# Patient Record
Sex: Male | Born: 1955 | Race: White | Hispanic: No | Marital: Married | Smoking: Never smoker
Health system: Southern US, Community
[De-identification: ages and names within clinical notes are randomized; demographics above are authoritative.]

## PROBLEM LIST (undated history)

## (undated) DIAGNOSIS — D649 Anemia, unspecified: Secondary | ICD-10-CM

## (undated) HISTORY — PX: HERNIA REPAIR: SHX51

## (undated) HISTORY — PX: MENISCUS REPAIR: SHX5179

## (undated) HISTORY — PX: LUMBAR MICRODISCECTOMY: SHX99

---

## 2020-07-09 ENCOUNTER — Encounter (HOSPITAL_COMMUNITY): Payer: Self-pay | Admitting: Emergency Medicine

## 2020-07-09 ENCOUNTER — Other Ambulatory Visit: Payer: Self-pay

## 2020-07-09 ENCOUNTER — Inpatient Hospital Stay (HOSPITAL_COMMUNITY)
Admission: EM | Admit: 2020-07-09 | Discharge: 2020-07-13 | DRG: 310 | Disposition: A | Discharge status: Home / Self Care | Payer: PRIVATE HEALTH INSURANCE | Attending: Cardiology | Admitting: Cardiology

## 2020-07-09 ENCOUNTER — Emergency Department (HOSPITAL_COMMUNITY): Payer: PRIVATE HEALTH INSURANCE

## 2020-07-09 DIAGNOSIS — Z862 Personal history of diseases of the blood and blood-forming organs and certain disorders involving the immune mechanism: Secondary | ICD-10-CM

## 2020-07-09 DIAGNOSIS — R0602 Shortness of breath: Secondary | ICD-10-CM | POA: Diagnosis present

## 2020-07-09 DIAGNOSIS — R7303 Prediabetes: Secondary | ICD-10-CM | POA: Diagnosis present

## 2020-07-09 DIAGNOSIS — I351 Nonrheumatic aortic (valve) insufficiency: Secondary | ICD-10-CM | POA: Diagnosis present

## 2020-07-09 DIAGNOSIS — I4891 Unspecified atrial fibrillation: Secondary | ICD-10-CM | POA: Diagnosis present

## 2020-07-09 DIAGNOSIS — D696 Thrombocytopenia, unspecified: Secondary | ICD-10-CM | POA: Diagnosis present

## 2020-07-09 DIAGNOSIS — I4892 Unspecified atrial flutter: Principal | ICD-10-CM | POA: Diagnosis present

## 2020-07-09 DIAGNOSIS — E785 Hyperlipidemia, unspecified: Secondary | ICD-10-CM | POA: Diagnosis present

## 2020-07-09 DIAGNOSIS — I483 Typical atrial flutter: Secondary | ICD-10-CM

## 2020-07-09 DIAGNOSIS — Z20822 Contact with and (suspected) exposure to covid-19: Secondary | ICD-10-CM | POA: Diagnosis present

## 2020-07-09 HISTORY — DX: Anemia, unspecified: D64.9

## 2020-07-09 LAB — BASIC METABOLIC PANEL
Anion gap: 8 (ref 5–15)
BUN: 26 mg/dL — ABNORMAL HIGH (ref 8–23)
CO2: 27 mmol/L (ref 22–32)
Calcium: 8.8 mg/dL — ABNORMAL LOW (ref 8.9–10.3)
Chloride: 101 mmol/L (ref 98–111)
Creatinine, Ser: 0.91 mg/dL (ref 0.61–1.24)
GFR, Estimated: >60 mL/min (ref >60)
Glucose, Bld: 94 mg/dL (ref 70–99)
Potassium: 3.9 mmol/L (ref 3.5–5.1)
Sodium: 136 mmol/L (ref 135–145)

## 2020-07-09 LAB — D-DIMER, QUANTITATIVE: D-Dimer, Quant: 0.57 ug/mL-FEU — ABNORMAL HIGH (ref 0.00–0.50)

## 2020-07-09 LAB — CBC
HCT: 42.5 % (ref 39.0–52.0)
Hemoglobin: 13.6 g/dL (ref 13.0–17.0)
MCH: 29.2 pg (ref 26.0–34.0)
MCHC: 32 g/dL (ref 30.0–36.0)
MCV: 91.4 fL (ref 80.0–100.0)
Platelets: 136 10*3/uL — ABNORMAL LOW (ref 150–400)
RBC: 4.65 MIL/uL (ref 4.22–5.81)
RDW: 12.8 % (ref 11.5–15.5)
WBC: 5.8 10*3/uL (ref 4.0–10.5)
nRBC: 0 % (ref 0.0–0.2)

## 2020-07-09 LAB — URINALYSIS, ROUTINE W REFLEX MICROSCOPIC
Bilirubin Urine: NEGATIVE
Glucose, UA: NEGATIVE mg/dL
Hgb urine dipstick: NEGATIVE
Ketones, ur: NEGATIVE mg/dL
Leukocytes,Ua: NEGATIVE
Nitrite: NEGATIVE
Protein, ur: NEGATIVE mg/dL
Specific Gravity, Urine: 1.019 (ref 1.005–1.030)
pH: 5 (ref 5.0–8.0)

## 2020-07-09 LAB — MAGNESIUM: Magnesium: 2.1 mg/dL (ref 1.7–2.4)

## 2020-07-09 LAB — BRAIN NATRIURETIC PEPTIDE: B Natriuretic Peptide: 83.5 pg/mL (ref 0.0–100.0)

## 2020-07-09 LAB — CBG MONITORING, ED: Glucose-Capillary: 98 mg/dL (ref 70–99)

## 2020-07-09 LAB — TROPONIN I (HIGH SENSITIVITY)
Troponin I (High Sensitivity): 6 ng/L (ref <18)
Troponin I (High Sensitivity): 6 ng/L (ref <18)

## 2020-07-09 LAB — TSH: TSH: 3.541 u[IU]/mL (ref 0.350–4.500)

## 2020-07-09 MED ORDER — HEPARIN BOLUS VIA INFUSION
3500.0000 [IU] | Freq: Once | INTRAVENOUS | Status: AC
  Administered 2020-07-09: 3500 [IU] via INTRAVENOUS
  Filled 2020-07-09: qty 3500

## 2020-07-09 MED ORDER — HEPARIN (PORCINE) 25000 UT/250ML-% IV SOLN
1000.0000 [IU]/h | INTRAVENOUS | Status: DC
  Administered 2020-07-10: 1000 [IU]/h via INTRAVENOUS
  Filled 2020-07-09: qty 250

## 2020-07-09 MED ORDER — SODIUM CHLORIDE 0.9% FLUSH
3.0000 mL | Freq: Two times a day (BID) | INTRAVENOUS | Status: DC
  Administered 2020-07-09 – 2020-07-13 (×3): 3 mL via INTRAVENOUS

## 2020-07-09 MED ORDER — DILTIAZEM HCL-DEXTROSE 125-5 MG/125ML-% IV SOLN (PREMIX)
5.0000 mg/h | INTRAVENOUS | Status: DC
  Administered 2020-07-10: 5 mg/h via INTRAVENOUS
  Filled 2020-07-09 (×2): qty 125

## 2020-07-09 MED ORDER — METOPROLOL TARTRATE 25 MG PO TABS
25.0000 mg | ORAL_TABLET | Freq: Once | ORAL | Status: AC
  Administered 2020-07-09: 25 mg via ORAL
  Filled 2020-07-09: qty 1

## 2020-07-09 NOTE — H&P (Signed)
HISTORY AND PHYSICAL  Patient ID: Marco White MRN: [id number] DOB/AGE: [birth year] 65 y.o.  Admit date: 07/09/2020  Attending physician: Derwood Kaplan, MD Primary Physician:  Pcp, No  Chief complaint: Shortness of breath and tachycardia  HPI:  Marco White is a 65 y.o. male who presents with a chief complaint of " shortness of breath and tachycardia." His past medical history and cardiovascular risk factors include: Anemia.  Patient is a practicing nephrologist at [employer] presents to the hospital with a chief complaint of shortness of breath and tachycardia.  He states that shortness of breath has been going on for at least 2 weeks intermittently mostly brought on by effort activities.  He states that his office is on the seventh floor and by the time he gets breath.  He denies any effort related chest pain.  He recently came back from a conference that was in [place] and after coming back home he checked his vitals and his heart rate was 140 bpm and blood pressures have been soft.  In addition to the shortness of breath he comes to the hospital for further evaluation and management.  No prior history of bleeding.  Currently on iron supplements.  ALLERGIES: No Known Allergies  PAST MEDICAL HISTORY: Past Medical History:  Diagnosis Date  . Anemia     PAST SURGICAL HISTORY: Past Surgical History:  Procedure Laterality Date  . HERNIA REPAIR    . LUMBAR MICRODISCECTOMY    . MENISCUS REPAIR      FAMILY HISTORY: No family history of premature coronary artery disease or sudden cardiac death.  SOCIAL HISTORY:  The patient  reports that he has never smoked. He has never used smokeless tobacco. He reports current alcohol use. He reports that he does not use drugs.  MEDICATIONS: No current outpatient medications  REVIEW OF SYSTEMS: Review of Systems  Constitutional: Negative for chills and fever.  HENT: Negative for hoarse voice and nosebleeds.   Eyes: Negative for  discharge, double vision and pain.  Cardiovascular: Positive for dyspnea on exertion. Negative for chest pain, claudication, leg swelling, near-syncope, orthopnea, palpitations, paroxysmal nocturnal dyspnea and syncope.  Respiratory: Positive for shortness of breath. Negative for hemoptysis.   Musculoskeletal: Negative for muscle cramps and myalgias.  Gastrointestinal: Negative for abdominal pain, constipation, diarrhea, hematemesis, hematochezia, melena, nausea and vomiting.  Neurological: Negative for dizziness and light-headedness.  All other systems reviewed and are negative.   PHYSICAL EXAM: Vitals with BMI 07/09/2020 07/09/2020  Height - 5\' 11"   Weight - 150 lbs  BMI - 20.93  Systolic 122 114  Diastolic 92 80  Pulse 139 82    No intake or output data in the 24 hours ending 07/09/20 2107  Net IO Since Admission: No IO data has been entered for this period [07/09/20 2107] CONSTITUTIONAL: Well-developed and well-nourished. No acute distress.  SKIN: Skin is warm and dry. No rash noted. No cyanosis. No pallor. No jaundice HEAD: Normocephalic and atraumatic.  EYES: No scleral icterus MOUTH/THROAT: Moist oral membranes.  NECK: No JVD present. No thyromegaly noted. No carotid bruits  LYMPHATIC: No visible cervical adenopathy.  CHEST Normal respiratory effort. No intercostal retractions  LUNGS: Clear to auscultation bilaterally.  No stridor. No wheezes. No rales.  CARDIOVASCULAR: Irregularly, tachycardic, variable S1-S2, no murmurs rubs or gallops appreciated secondary to tachycardia ABDOMINAL: No apparent ascites.  EXTREMITIES: No peripheral edema  HEMATOLOGIC: No significant bruising NEUROLOGIC: Oriented to person, place, and time. Nonfocal. Normal muscle tone.  PSYCHIATRIC: Normal mood and affect.  Normal behavior. Cooperative  RADIOLOGY: DG Chest Portable 1 View  Result Date: 07/09/2020 CLINICAL DATA:  Exertional shortness of breath. Dizziness for the past hour. Tachycardia. EXAM:  PORTABLE CHEST 1 VIEW COMPARISON:  None. FINDINGS: Mildly enlarged cardiac silhouette. Clear lungs with normal vascularity. Mild lower thoracic spine degenerative change. IMPRESSION: Mild cardiomegaly. No acute abnormality. Electronically Signed   By: Beckie Salts M.D.   On: 07/09/2020 20:07    LABORATORY DATA: Lab Results  Component Value Date   WBC 5.8 07/09/2020   HGB 13.6 07/09/2020   HCT 42.5 07/09/2020   MCV 91.4 07/09/2020   PLT 136 (L) 07/09/2020    Recent Labs  Lab 07/09/20 1934  NA 136  K 3.9  CL 101  CO2 27  BUN 26*  CREATININE 0.91  CALCIUM 8.8*  GLUCOSE 94    Lipid Panel  No results found for: CHOL, HDL, LDLCALC, LDLDIRECT, TRIG, CHOLHDL  BNP (last 3 results) Recent Labs    07/09/20 1934  BNP 83.5    HEMOGLOBIN A1C No results found for: HGBA1C, MPG  Cardiac Panel (last 3 results) No results for input(s): CKTOTAL, CKMB, RELINDX in the last 8760 hours.  Invalid input(s): TROPONINHS  No results found for: CKTOTAL, CKMB, CKMBINDEX   TSH No results for input(s): TSH in the last 8760 hours.    CARDIAC DATABASE: EKG: 07/09/2020: Atypical atrial flutter, 99 bpm, incomplete right bundle branch block, left posterior fascicular block, without underlying injury pattern.  Echocardiogram: None  Stress Testing:  None  Heart Catheterization: None  IMPRESSION & RECOMMENDATIONS: Marco White is a 65 y.o. male whose past medical history and cardiovascular risk factors include: Anemia.  #1 Narrow complex tachycardia (suggestive of atrial flutter) Recommend initiation of IV diltiazem.   However, patient would prefer oral medication given his soft blood pressures noted at home.  Shared decision was to start Lopressor 25 mg p.o. x1 as per patient's wishes; however, if no change in ventricular rate will IV diltiazem drip for rate control. Since his symptoms have been going on for approximately 2 weeks I suspect that he has been having underlying paroxysmal  atrial flutter and therefore would recommend anticoagulation for 4 weeks.  CHA2DS2-VASc score is 0 based on what is known thus far; therefore, I do not anticipate that he will require anticoagulation long-term.  But will check echo to evaluate his EF and a1c to screen for diabetes. Further recommendation is to follow. Check hemoglobin A1c, TSH.  #2 Shortness of breath: Most likely secondary to #1 Check CBC to rule out underlying anemia Check BNP  Check D-Dimer, due to tachycardia / recent travel  Echo in the morning.   #3 Hx of anemia:  On oral supplements.  Check Hb Does not endorse evidence of bleeding.   Consultants: None   Code Status: Full Code    Family Communication:  Spoke to his wife Dr. Vickey Huger who was present at bedside.    Disposition Plan: Home    Total encounter time 83 minutes. *Total Encounter Time as defined by the Centers for Medicare and Medicaid Services includes, in addition to the face-to-face time of a patient visit (documented in the note above) non-face-to-face time: obtaining and reviewing outside history, ordering and reviewing medications, tests or procedures, care coordination (communications with other health care professionals or caregivers) and documentation in the medical record.  Patient's questions and concerns were addressed to his satisfaction. He voices understanding of the instructions provided during this encounter.   This note was created  using a voice recognition software as a result there may be grammatical errors inadvertently enclosed that do not reflect the nature of this encounter. Every attempt is made to correct such errors.  Delilah Shan [facility]  Pager: [phone] Office: [phone] 07/09/2020, 9:07 PM

## 2020-07-09 NOTE — ED Provider Notes (Signed)
[name] 4TH FLOOR PROGRESSIVE CARE AND UROLOGY Provider Note   CSN: [id number] Arrival date & time: 07/09/20  1915     History Chief Complaint  Patient presents with  . Near Syncope    Marco White is a 65 y.o. male.  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 Patient reports that he has had episodic dizziness for the last 2 or 3 weeks.  He is very active and climbs up 7 stories to get to work, and on couple of occasions he was short of breath at the end of it.  He was in [place] for a conference and while running to the bus he felt short of breath with chief found to be unusual yesterday.  Today he was walking around his house and had dizziness type feeling and checked his heart rate, it was in the 140s.  He came to the ER, and they have already notified the cardiology service to come and see him as well.  Pt has no hx of PE, DVT and denies any exogenous hormone (testosterone / estrogen) use, long distance travels or surgery in the past 6 weeks, active cancer, recent immobilization.  There is no history of severe anemia, thyroid disorder, substance use/abuse, heavy alcohol use.  No family history of premature CAD.  No history of COVID-19.  Past Medical History:  Diagnosis Date  . Anemia     Patient Active Problem List   Diagnosis Date Noted  . Shortness of breath 07/09/2020    Past Surgical History:  Procedure Laterality Date  . HERNIA REPAIR    . LUMBAR MICRODISCECTOMY    . MENISCUS REPAIR         History reviewed. No pertinent family history.  Social History   Tobacco Use  . Smoking status: Never Smoker  . Smokeless tobacco: Never Used  Vaping Use  . Vaping Use: Never used  Substance Use Topics  . Alcohol use: Yes    Comment: Occasional  . Drug use: Never    Home Medications Prior to Admission medications   Medication Sig Start Date End Date Taking? Authorizing Provider  cholecalciferol  (VITAMIN D3) 25 MCG (1000 UNIT) tablet Take 1,000 Units by mouth daily.   Yes [provider]  finasteride (PROSCAR) 5 MG tablet Take 1 mg by mouth daily.   Yes [provider]  NONFORMULARY OR COMPOUNDED ITEM Apply 1 application topically daily. Fluconazole 2%Terb 1% DMSO - Apply to toe nail   Yes [provider]    Allergies    Patient has no known allergies.  Review of Systems   Review of Systems  Constitutional: Positive for activity change.  Respiratory: Positive for shortness of breath.   Cardiovascular: Negative for chest pain and palpitations.  Neurological: Positive for dizziness.  All other systems reviewed and are negative.   Physical Exam Updated Vital Signs BP 126/81   Pulse 82   Temp 97.8 F (36.6 C) (Oral)   Resp 14   Ht 5\' 11"  (1.803 m)   Wt 68 kg   SpO2 100%   BMI 20.92 kg/m   Physical Exam Vitals and nursing note reviewed.  Constitutional:      Appearance: He is well-developed.  HENT:     Head: Atraumatic.  Cardiovascular:     Rate and Rhythm: Normal rate.  Pulmonary:     Effort: Pulmonary effort is normal.  Musculoskeletal:     Cervical back: Neck  supple.  Skin:    General: Skin is warm.  Neurological:     Mental Status: He is alert and oriented to person, place, and time.     ED Results / Procedures / Treatments   Labs (all labs ordered are listed, but only abnormal results are displayed) Labs Reviewed  BASIC METABOLIC PANEL - Abnormal; Notable for the following components:      Result Value   BUN 26 (*)    Calcium 8.8 (*)    All other components within normal limits  CBC - Abnormal; Notable for the following components:   Platelets 136 (*)    All other components within normal limits  D-DIMER, QUANTITATIVE - Abnormal; Notable for the following components:   D-Dimer, Quant 0.57 (*)    All other components within normal limits  SARS CORONAVIRUS 2 (TAT 6-24 HRS)  URINALYSIS, ROUTINE W REFLEX MICROSCOPIC   BRAIN NATRIURETIC PEPTIDE  TSH  MAGNESIUM  HIV ANTIBODY (ROUTINE TESTING W REFLEX)  APTT  PROTIME-INR  HEPARIN LEVEL (UNFRACTIONATED)  CBC  CBG MONITORING, ED  TROPONIN I (HIGH SENSITIVITY)  TROPONIN I (HIGH SENSITIVITY)    EKG EKG Interpretation  Date/Time:  Saturday July 09 2020 20:19:52 EDT Ventricular Rate:  140 PR Interval:  117 QRS Duration: 117 QT Interval:  342 QTC Calculation: 522 R Axis:   102 Text Interpretation: Sinus tachycardia Consider right atrial enlargement RBBB and LPFB Inferior infarct, acute Lateral leads are also involved Confirmed by Derwood Kaplan [phone]) on 07/09/2020 8:35:58 PM A flutter versus atrial tachycardia    Radiology DG Chest Portable 1 View  Result Date: 07/09/2020 CLINICAL DATA:  Exertional shortness of breath. Dizziness for the past hour. Tachycardia. EXAM: PORTABLE CHEST 1 VIEW COMPARISON:  None. FINDINGS: Mildly enlarged cardiac silhouette. Clear lungs with normal vascularity. Mild lower thoracic spine degenerative change. IMPRESSION: Mild cardiomegaly. No acute abnormality. Electronically Signed   By: Beckie Salts M.D.   On: 07/09/2020 20:07    Procedures .Critical Care Performed by: Derwood Kaplan, MD Authorized by: Derwood Kaplan, MD   Critical care provider statement:    Critical care time (minutes):  42   Critical care was necessary to treat or prevent imminent or life-threatening deterioration of the following conditions:  Circulatory failure and cardiac failure   Critical care was time spent personally by me on the following activities:  Discussions with consultants, evaluation of patient's response to treatment, examination of patient, ordering and performing treatments and interventions, ordering and review of laboratory studies, ordering and review of radiographic studies, pulse oximetry, re-evaluation of patient's condition, obtaining history from patient or surrogate and review of old charts     Medications Ordered  in ED Medications  sodium chloride flush (NS) 0.9 % injection 3 mL (3 mLs Intravenous Given 07/09/20 2219)  diltiazem (CARDIZEM) 125 mg in dextrose 5% 125 mL (1 mg/mL) infusion (has no administration in time range)  heparin bolus via infusion 3,500 Units (has no administration in time range)  heparin ADULT infusion 100 units/mL (25000 units/229mL) (has no administration in time range)  metoprolol tartrate (LOPRESSOR) tablet 25 mg (25 mg Oral Given 07/09/20 2039)    ED Course  I have reviewed the triage vital signs and the nursing notes.  Pertinent labs & imaging results that were available during my care of the patient were reviewed by me and considered in my medical decision making (see chart for details).    MDM Rules/Calculators/A&P  65 year old healthy male comes in with chief complaint of dizziness and elevated heart rate. Initial EKG shows heart rate in the 140s.  Initial concerns for me is that patient likely has a flutter as the underlying cause.  Differential also includes sinus tachycardia and less likely PSVT.  Patient has no risk factors for PE, DVT.  He does not appear that he is having thyroid storm.  He has iron deficiency with no history of severe anemia and denies any blood loss.  Family history is reassuring and there is no chest pain -concerns for ACS.  No murmurs on exam.  Patient was given oral metoprolol after discussion with cardiology service. Patient's heart rate improved to 86, and we could clearly see that he was in a flutter.  Heparin initiated.  Spoke with cardiology service again, they would like patient to get diltiazem drip, which we have initiated.  Results of the ER work-up has been discussed with the patient.  CHA2DS2-VASc Score = 0  The patient's score is based upon: CHF History: No HTN History: No Diabetes History: No Stroke History: No Vascular Disease History: No Age Score: 0 Gender Score: 0      ASSESSMENT AND  PLAN: Atrial flutter   Signed,  Derwood Kaplan, MD    07/09/2020 11:55 PM     Final Clinical Impression(s) / ED Diagnoses Final diagnoses:  Atrial fibrillation, unspecified type [facility])    Rx / DC Orders ED Discharge Orders    None       Derwood Kaplan, MD 07/09/20 2356

## 2020-07-09 NOTE — ED Triage Notes (Signed)
Patient states that about an hour ago, he felt lightheaded and dizzy. Patient states that he took his blood pressure and pulse at home. Patient states pulse was 140.

## 2020-07-09 NOTE — Progress Notes (Signed)
ANTICOAGULATION CONSULT NOTE - Initial Consult  Pharmacy Consult for heparin Indication: atrial fibrillation  No Known Allergies  Patient Measurements: Height: 5\' 11"  (180.3 cm) Weight: 68 kg (150 lb) IBW/kg (Calculated) : 75.3 Heparin Dosing Weight: 68kg  Vital Signs: Temp: 98.3 F (36.8 C) (04/09 1926) Temp Source: Oral (04/09 1926) BP: 113/85 (04/09 2130) Pulse Rate: 52 (04/09 2130)  Labs: Recent Labs    07/09/20 1934  HGB 13.6  HCT 42.5  PLT 136*  CREATININE 0.91  TROPONINIHS 6    Estimated Creatinine Clearance: 81 mL/min (by C-G formula based on SCr of 0.91 mg/dL).   Medical History: Past Medical History:  Diagnosis Date  . Anemia      Assessment: 65 yo male presents with exertional SOB and dizziness.  Pharmacy consulted to dose heparin drip for Afib.  No prior AC noted  Hgb 13.6 Plts 136 Scr WNL Baseline aPTT and pt/inr ordered  Goal of Therapy:  Heparin level 0.3-0.7 units/ml Monitor platelets by anticoagulation protocol:   Plan:  Heparin bolus 3500 units Start heparin drip at 1000 units/hr Heparin level in 6 hours Daily CBC  68 RPh 07/09/2020, 10:26 PM

## 2020-07-09 NOTE — ED Triage Notes (Signed)
Emergency Medicine Provider Triage Evaluation Note  Marco White , a 65 y.o. male  was evaluated in triage.  Pt complains of exertional shortness of breath and dizziness.  Patient reports that his exertional shortness of breath has been present for last few weeks.  Has gradually progressed over this time.  Patient reports that today after an episode of walking he felt lightheaded and dizzy.  Patient checked his blood pressure and pulse found to have a pulse rate of 140.  Patient denies any palpitations or syncope.  Review of Systems  Positive: Shortness of breath on exertion, lightheadedness, dizziness Negative: Fevers, chills, chest pain, leg swelling, cough  Physical Exam  BP 114/80 (BP Location: Right Arm)   Pulse 82   Temp 98.3 F (36.8 C) (Oral)   Resp 16   Ht 5\' 11"  (1.803 m)   Wt 68 kg   SpO2 100%   BMI 20.92 kg/m  Gen:   Awake, no distress   HEENT:  Atraumatic  Resp:  Normal effort, lungs clear to auscultation bilaterally Cardiac:  Normal rate, no murmurs rubs or gallops, +3 radial pulse bilaterally MSK:   Moves extremities without difficulty  Neuro:  Speech clear   Medical Decision Making  Medically screening exam initiated at 7:48 PM.  Appropriate orders placed.  Zaiyden Strozier was informed that the remainder of the evaluation will be completed by another provider, this initial triage assessment does not replace that evaluation, and the importance of remaining in the ED until their evaluation is complete.  Clinical Impression   The patient appears stable so that the remainder of the work up may be completed by another provider.      Lester Kinsman, Haskel Schroeder 07/09/20 1950

## 2020-07-09 NOTE — ED Notes (Signed)
ED TO INPATIENT HANDOFF REPORT  Name/Age/Gender Marco White 65 y.o. male  Code Status    Code Status Orders  (From admission, onward)         Start     Ordered   07/09/20 2133  Full code  Continuous        07/09/20 2135        Code Status History    This patient has a current code status but no historical code status.   Advance Care Planning Activity      Home/SNF/Other Home  Chief Complaint Shortness of breath [R06.02]  Level of Care/Admitting Diagnosis ED Disposition    ED Disposition Condition Comment   Admit  Hospital Area: [facility] [[id number]]  Level of Care: Telemetry [5]  Admit to tele based on following criteria: Other see comments  Comments: new atrial flutter  Covid Evaluation: Asymptomatic Screening Protocol (No Symptoms)  Diagnosis: Shortness of breath [786.05.ICD-9-CM]  Admitting Physician: Tessa Lerner [[id number]]  Attending Physician: Tessa Lerner [[id number]]  Bed request comments: cardiac floor (IV drips)       Medical History Past Medical History:  Diagnosis Date  . Anemia     Allergies No Known Allergies  IV Location/Drains/Wounds Patient Lines/Drains/Airways Status    Active Line/Drains/Airways    None          Labs/Imaging Results for orders placed or performed during the hospital encounter of 07/09/20 (from the past 48 hour(s))  Urinalysis, Routine w reflex microscopic     Status: None   Collection Time: 07/09/20  7:21 PM  Result Value Ref Range   Color, Urine YELLOW YELLOW   APPearance CLEAR CLEAR   Specific Gravity, Urine 1.019 1.005 - 1.030   pH 5.0 5.0 - 8.0   Glucose, UA NEGATIVE NEGATIVE mg/dL   Hgb urine dipstick NEGATIVE NEGATIVE   Bilirubin Urine NEGATIVE NEGATIVE   Ketones, ur NEGATIVE NEGATIVE mg/dL   Protein, ur NEGATIVE NEGATIVE mg/dL   Nitrite NEGATIVE NEGATIVE   Leukocytes,Ua NEGATIVE NEGATIVE    Comment: Performed at [facility], [address]  [place], [place] [address]  CBG monitoring, ED     Status: None   Collection Time: 07/09/20  7:33 PM  Result Value Ref Range   Glucose-Capillary 98 70 - 99 mg/dL    Comment: Glucose reference range applies only to samples taken after fasting for at least 8 hours.  Basic metabolic panel     Status: Abnormal   Collection Time: 07/09/20  7:34 PM  Result Value Ref Range   Sodium 136 135 - 145 mmol/L   Potassium 3.9 3.5 - 5.1 mmol/L   Chloride 101 98 - 111 mmol/L   CO2 27 22 - 32 mmol/L   Glucose, Bld 94 70 - 99 mg/dL    Comment: Glucose reference range applies only to samples taken after fasting for at least 8 hours.   BUN 26 (H) 8 - 23 mg/dL   Creatinine, Ser 8.36 0.61 - 1.24 mg/dL   Calcium 8.8 (L) 8.9 - 10.3 mg/dL   GFR, Estimated >62 >94 mL/min    Comment: (NOTE) Calculated using the CKD-EPI Creatinine Equation (2021)    Anion gap 8 5 - 15    Comment: Performed at [facility], [address], [place], [place] [address]  CBC     Status: Abnormal   Collection Time: 07/09/20  7:34 PM  Result Value Ref Range   WBC 5.8 4.0 - 10.5 K/uL  Comment: REPEATED TO VERIFY WHITE COUNT CONFIRMED ON SMEAR    RBC 4.65 4.22 - 5.81 MIL/uL   Hemoglobin 13.6 13.0 - 17.0 g/dL   HCT 27.0 35.0 - 09.3 %   MCV 91.4 80.0 - 100.0 fL   MCH 29.2 26.0 - 34.0 pg   MCHC 32.0 30.0 - 36.0 g/dL   RDW 81.8 29.9 - 37.1 %   Platelets 136 (L) 150 - 400 K/uL   nRBC 0.0 0.0 - 0.2 %    Comment: Performed at [facility], [address], [place], [place] [address]  Troponin I (High Sensitivity)     Status: None   Collection Time: 07/09/20  7:34 PM  Result Value Ref Range   Troponin I (High Sensitivity) 6 <18 ng/L    Comment: (NOTE) Elevated high sensitivity troponin I (hsTnI) values and significant  changes across serial measurements may suggest ACS but many other  chronic and acute conditions are known to elevate hsTnI results.  Refer to the "Links" section for chest pain  algorithms and additional  guidance. Performed at [facility], [address] [place], [place] [address]   Brain natriuretic peptide     Status: None   Collection Time: 07/09/20  7:34 PM  Result Value Ref Range   B Natriuretic Peptide 83.5 0.0 - 100.0 pg/mL    Comment: Performed at [facility], [address], [place], [place] [address]  Magnesium     Status: None   Collection Time: 07/09/20  7:34 PM  Result Value Ref Range   Magnesium 2.1 1.7 - 2.4 mg/dL    Comment: Performed at [facility], [address] [place], [place] [address]  D-dimer, quantitative     Status: Abnormal   Collection Time: 07/09/20  8:26 PM  Result Value Ref Range   D-Dimer, Quant 0.57 (H) 0.00 - 0.50 ug/mL-FEU    Comment: (NOTE) At the manufacturer cut-off value of 0.5 g/mL FEU, this assay has a negative predictive value of 95-100%.This assay is intended for use in conjunction with a clinical pretest probability (PTP) assessment model to exclude pulmonary embolism (PE) and deep venous thrombosis (DVT) in outpatients suspected of PE or DVT. Results should be correlated with clinical presentation. Performed at [facility], [address] [place], [place] [address]    DG Chest Portable 1 View  Result Date: 07/09/2020 CLINICAL DATA:  Exertional shortness of breath. Dizziness for the past hour. Tachycardia. EXAM: PORTABLE CHEST 1 VIEW COMPARISON:  None. FINDINGS: Mildly enlarged cardiac silhouette. Clear lungs with normal vascularity. Mild lower thoracic spine degenerative change. IMPRESSION: Mild cardiomegaly. No acute abnormality. Electronically Signed   By: Beckie Salts M.D.   On: 07/09/2020 20:07    Pending Labs Unresulted Labs (From admission, onward)          Start     Ordered   07/09/20 2136  HIV Antibody (routine testing w rflx)  (HIV Antibody (Routine testing w reflex) panel)  Once,   STAT        07/09/20 2135    07/09/20 2028  SARS CORONAVIRUS 2 (TAT 6-24 HRS) Nasopharyngeal Nasopharyngeal Swab  (Tier 3 - Symptomatic/asymptomatic with Precautions)  Once,   STAT       Question Answer Comment  Is this test for diagnosis or screening Screening   Symptomatic for COVID-19 as defined by CDC No   Hospitalized for COVID-19 No   Admitted to ICU for COVID-19 No   Previously  tested for COVID-19 No   Resident in a congregate (group) care setting No   Employed in healthcare setting No   Has patient completed COVID vaccination(s) (2 doses of Pfizer/Moderna 1 dose of [employer]) Yes   Has patient completed COVID Booster / 3rd dose Yes      07/09/20 2027   07/09/20 2026  TSH  ONCE - STAT,   STAT        07/09/20 2025          Vitals/Pain Today's Vitals   07/09/20 1931 07/09/20 2030 07/09/20 2112 07/09/20 2130  BP:  (!) 122/92 (!) 204/87 113/85  Pulse:  (!) 139 (!) 138 (!) 52  Resp:  (!) 21 18 20   Temp:      TempSrc:      SpO2:  99% 99% 99%  Weight:      Height:      PainSc: 0-No pain       Isolation Precautions Airborne and Contact precautions  Medications Medications  sodium chloride flush (NS) 0.9 % injection 3 mL (has no administration in time range)  metoprolol tartrate (LOPRESSOR) tablet 25 mg (25 mg Oral Given 07/09/20 2039)    Mobility walks

## 2020-07-10 ENCOUNTER — Observation Stay (HOSPITAL_COMMUNITY): Payer: PRIVATE HEALTH INSURANCE

## 2020-07-10 DIAGNOSIS — R0602 Shortness of breath: Secondary | ICD-10-CM | POA: Diagnosis present

## 2020-07-10 DIAGNOSIS — I4891 Unspecified atrial fibrillation: Secondary | ICD-10-CM | POA: Diagnosis present

## 2020-07-10 DIAGNOSIS — I4892 Unspecified atrial flutter: Secondary | ICD-10-CM | POA: Diagnosis present

## 2020-07-10 DIAGNOSIS — E785 Hyperlipidemia, unspecified: Secondary | ICD-10-CM | POA: Diagnosis present

## 2020-07-10 DIAGNOSIS — I351 Nonrheumatic aortic (valve) insufficiency: Secondary | ICD-10-CM

## 2020-07-10 DIAGNOSIS — Z20822 Contact with and (suspected) exposure to covid-19: Secondary | ICD-10-CM | POA: Diagnosis present

## 2020-07-10 DIAGNOSIS — D696 Thrombocytopenia, unspecified: Secondary | ICD-10-CM | POA: Diagnosis present

## 2020-07-10 DIAGNOSIS — I483 Typical atrial flutter: Secondary | ICD-10-CM

## 2020-07-10 DIAGNOSIS — R7303 Prediabetes: Secondary | ICD-10-CM | POA: Diagnosis present

## 2020-07-10 LAB — BASIC METABOLIC PANEL
Anion gap: 9 (ref 5–15)
BUN: 19 mg/dL (ref 8–23)
CO2: 26 mmol/L (ref 22–32)
Calcium: 8.8 mg/dL — ABNORMAL LOW (ref 8.9–10.3)
Chloride: 102 mmol/L (ref 98–111)
Creatinine, Ser: 0.67 mg/dL (ref 0.61–1.24)
GFR, Estimated: >60 mL/min (ref >60)
Glucose, Bld: 89 mg/dL (ref 70–99)
Potassium: 3.5 mmol/L (ref 3.5–5.1)
Sodium: 137 mmol/L (ref 135–145)

## 2020-07-10 LAB — CBC
HCT: 40.1 % (ref 39.0–52.0)
Hemoglobin: 13.5 g/dL (ref 13.0–17.0)
MCH: 30.2 pg (ref 26.0–34.0)
MCHC: 33.7 g/dL (ref 30.0–36.0)
MCV: 89.7 fL (ref 80.0–100.0)
Platelets: 122 10*3/uL — ABNORMAL LOW (ref 150–400)
RBC: 4.47 MIL/uL (ref 4.22–5.81)
RDW: 12.7 % (ref 11.5–15.5)
WBC: 5.3 10*3/uL (ref 4.0–10.5)
nRBC: 0 % (ref 0.0–0.2)

## 2020-07-10 LAB — ECHOCARDIOGRAM COMPLETE
Area-P 1/2: 3.85 cm2
Height: 71 in
P 1/2 time: 606 msec
S' Lateral: 3.2 cm
Single Plane A4C EF: 65.9 %
Weight: 2462.1 oz

## 2020-07-10 LAB — MAGNESIUM: Magnesium: 2.1 mg/dL (ref 1.7–2.4)

## 2020-07-10 LAB — APTT: aPTT: 29 seconds (ref 24–36)

## 2020-07-10 LAB — SARS CORONAVIRUS 2 (TAT 6-24 HRS): SARS Coronavirus 2: NEGATIVE

## 2020-07-10 LAB — HEPARIN LEVEL (UNFRACTIONATED)
Heparin Unfractionated: 0.32 IU/mL (ref 0.30–0.70)
Heparin Unfractionated: 0.3 IU/mL (ref 0.30–0.70)

## 2020-07-10 LAB — TROPONIN I (HIGH SENSITIVITY)
Troponin I (High Sensitivity): 6 ng/L (ref <18)
Troponin I (High Sensitivity): 6 ng/L (ref <18)

## 2020-07-10 LAB — GLUCOSE, CAPILLARY: Glucose-Capillary: 86 mg/dL (ref 70–99)

## 2020-07-10 LAB — HIV ANTIBODY (ROUTINE TESTING W REFLEX): HIV Screen 4th Generation wRfx: NONREACTIVE

## 2020-07-10 LAB — PROTIME-INR
INR: 1.1 (ref 0.8–1.2)
Prothrombin Time: 13.6 seconds (ref 11.4–15.2)

## 2020-07-10 MED ORDER — METOPROLOL TARTRATE 12.5 MG HALF TABLET
12.5000 mg | ORAL_TABLET | Freq: Two times a day (BID) | ORAL | Status: DC
  Administered 2020-07-10 – 2020-07-13 (×4): 12.5 mg via ORAL
  Filled 2020-07-10 (×5): qty 1

## 2020-07-10 MED ORDER — METOPROLOL TARTRATE 25 MG PO TABS
25.0000 mg | ORAL_TABLET | Freq: Two times a day (BID) | ORAL | Status: DC
  Administered 2020-07-10: 25 mg via ORAL
  Filled 2020-07-10: qty 1

## 2020-07-10 MED ORDER — HEPARIN (PORCINE) 25000 UT/250ML-% IV SOLN
1100.0000 [IU]/h | INTRAVENOUS | Status: AC
  Administered 2020-07-10 (×2): 1050 [IU]/h via INTRAVENOUS
  Administered 2020-07-11: 1100 [IU]/h via INTRAVENOUS
  Filled 2020-07-10 (×3): qty 250

## 2020-07-10 MED ORDER — SODIUM CHLORIDE 0.9 % IV SOLN: INTRAVENOUS | Status: AC

## 2020-07-10 MED ORDER — POTASSIUM CHLORIDE CRYS ER 20 MEQ PO TBCR
40.0000 meq | EXTENDED_RELEASE_TABLET | Freq: Once | ORAL | Status: AC
  Administered 2020-07-10: 40 meq via ORAL
  Filled 2020-07-10: qty 2

## 2020-07-10 NOTE — Plan of Care (Signed)
  Problem: Education: Goal: Knowledge of General Education information will improve Description: Including pain rating scale, medication(s)/side effects and non-pharmacologic comfort measures Outcome: Progressing   Problem: Clinical Measurements: Goal: Ability to maintain clinical measurements within normal limits will improve Outcome: Progressing   Problem: Clinical Measurements: Goal: Cardiovascular complication will be avoided Outcome: Progressing   Problem: Activity: Goal: Risk for activity intolerance will decrease Outcome: Progressing   

## 2020-07-10 NOTE — Progress Notes (Signed)
ON CALL CARDIOLOGY   Patient got to medical floor for AFL management his HR was between 60-70bpm and his nurse Rose called if Cardizem needs to be started.   Recommended to hold off starting cardizem for now and to keep it at bedside if needed over night if he goes into RVR. And to get an EKG to document the rhythm.   Got paged 1:16am that the EKG ordered reads out "Acute MI." Rapid response team was examining the patient.   That EKG is not on Epic and I requested that it be sent to me for review. EKG was done 07/10/2020 at 01:01:12. EKG reviewed and it notes Atrial flutter 4:1 conduction at HR of 69bpm. No STEMI.   VS: 115/78, MAP 89, RR 16bpm, HR 83bpm, SaO2 100% on RA.   Patient denies CP or SOB.  hsTrop checked earlier in the evening notes flat response.   Currently on IV Heparin due to AFL w/ RVR on admission.   Recommendation: 1. Close monitoring of the patient and frequent vital checks.  2. Repeat troponin x 2.  3. Repeat EKG in  4. Continue IV Heparin  5. Call if questions arise. Spoke to both patient's nurse Rose and Rapid Response nurse Genell.   Tessa Lerner, [place], [facility]  Pager: [phone] Office: [phone]

## 2020-07-10 NOTE — Progress Notes (Addendum)
ANTICOAGULATION CONSULT NOTE - Follow Up  Pharmacy Consult for heparin Indication: Atrial fibrillation  No Known Allergies  Patient Measurements: Height: 5\' 11"  (180.3 cm) Weight: 69.8 kg (153 lb 14.1 oz) IBW/kg (Calculated) : 75.3 Heparin Dosing Weight: n/a. Use TBW = 70 kg  Vital Signs: Temp: 97.8 F (36.6 C) (04/10 0749) Temp Source: Oral (04/10 0451) BP: 93/61 (04/10 0749) Pulse Rate: 70 (04/10 0749)  Labs: Recent Labs    07/09/20 1934 07/09/20 2151 07/09/20 2216 07/10/20 0426 07/10/20 0705  HGB 13.6  --   --  13.5  --   HCT 42.5  --   --  40.1  --   PLT 136*  --   --  122*  --   APTT  --   --  29  --   --   LABPROT  --   --  13.6  --   --   INR  --   --  1.1  --   --   HEPARINUNFRC  --   --   --   --  0.32  CREATININE 0.91  --   --  0.67  --   TROPONINIHS 6 6  --  6 6    Estimated Creatinine Clearance: 94.5 mL/min (by C-G formula based on SCr of 0.67 mg/dL).   Medical History: Past Medical History:  Diagnosis Date  . Anemia     Medications: Pt not taking anticoagulants PTA  Assessment: Pt is a 75 yoM presenting with SOB, tachycardia. PMH significant for anemia. Cardiology consulted - initiated heparin drip for atrial flutter/afib, pharmacy to dose.  Today, 07/10/20  HL = 0.32 is therapeutic on heparin infusion of 1000 units/hr  CBC: Hgb WNL, Plt (122) slightly low  SCr WNL  Confirmed with RN that heparin infusing at correct rate. No signs of bleeding.  Goal of Therapy:  Heparin level 0.3-0.7 units/ml Monitor platelets by anticoagulation protocol: Yes   Plan:   Continue heparin infusion at current rate of 1000 units/hr  Check confirmatory 6 hour HL  HL, CBC daily while on heparin infusion  Monitor for signs of bleeding  09/09/20, PharmD 07/10/2020,9:05 AM   Addendum - Afternoon Follow Up:  Assessment:  Confirmatory HL = 0.30 is on lower end of therapeutic range on heparin infusion of 1000 units/hr  Confirmed with RN that  heparin infusing at correct rate. No signs of bleeding.   Goal: HL 0.3 - 0.7  Plan:  Slightly increase heparin infusion to 1050 units/hr  Check 6 hour HL  HL, CBC daily while on heparin infusion  09/09/2020, PharmD 07/10/20 3:22 PM

## 2020-07-10 NOTE — Progress Notes (Addendum)
Rapid Response Event Note   Reason for Call : EKG shows Acute MI   Initial Focused Assessment: Pt lying in bed, A/O and following commands.  Pt denies chest pain, SOB or any other pain.  Pulses intact and irregular.     Interventions: Cardiology on call informed and after reviewing  EKG was done and reviewed and it notes Atrial flutter 4:1 conduction at HR of 69 bpm. No STEMI per MD. Will repeat Troponin x 2, continue heparin drip.  Pt informed to let his nurse know if he he has any pain or issues breathing. See flowhseet for VS. Repeat EKG to be done.  MD made aware.   Plan of Care: Pt to remain in current location at this time.    Conley Rolls, RN

## 2020-07-10 NOTE — Progress Notes (Signed)
  Echocardiogram 2D Echocardiogram has been performed.  Marco White 07/10/2020, 8:56 AM

## 2020-07-10 NOTE — Progress Notes (Signed)
Progress Note  Patient Name: Marco White Date of Encounter: 07/10/2020  Attending physician: Tessa Lerner, DO Primary care provider: Pcp, No Primary Cardiologist: NA Consultant:Saveah Bahar Odis Hollingshead, DO  Subjective: Marco White is a 65 y.o. male who was seen and examined at bedside at approximately 4pm No events overnight. Patient denies any chest pain or shortness of breath at rest or with effort late activities. Denies orthopnea, paroxysmal nocturnal dyspnea or lower extremity swelling. Case discussed and reviewed with his nurse.  Objective: Vital Signs in the last 24 hours: Temp:  [97.7 F (36.5 C)-98.3 F (36.8 C)] 98.3 F (36.8 C) (04/10 1300) Pulse Rate:  [52-139] 69 (04/10 1300) Resp:  [12-21] 16 (04/10 1300) BP: (91-204)/(50-94) 99/50 (04/10 1300) SpO2:  [98 %-100 %] 99 % (04/10 1300) Weight:  [68 kg-69.8 kg] 69.8 kg (04/10 0451)  Intake/Output:  Intake/Output Summary (Last 24 hours) at 07/10/2020 1651 Last data filed at 07/10/2020 1200 Gross per 24 hour  Intake 296.13 ml  Output 875 ml  Net -578.87 ml    Net IO Since Admission: -578.87 mL [07/10/20 1651]  Weights:  Filed Weights   07/09/20 1926 07/10/20 0451  Weight: 68 kg 69.8 kg    Telemetry: Personally reviewed-atrial flutter controlled ventricular rate  Physical examination: PHYSICAL EXAM: Vitals with BMI 07/10/2020 07/10/2020 07/10/2020  Height - - -  Weight - - -  BMI - - -  Systolic 99 93 91  Diastolic 50 61 58  Pulse 69 70 70    CONSTITUTIONAL: Well-developed and well-nourished. No acute distress.  SKIN: Skin is warm and dry. No rash noted. No cyanosis. No pallor. No jaundice HEAD: Normocephalic and atraumatic.  EYES: No scleral icterus MOUTH/THROAT: Moist oral membranes.  NECK: No JVD present. No thyromegaly noted. No carotid bruits  LYMPHATIC: No visible cervical adenopathy.  CHEST Normal respiratory effort. No intercostal retractions  LUNGS: Clear to auscultation bilaterally.  No stridor. No  wheezes. No rales.  CARDIOVASCULAR: Irregularly, variable S1-S2, no murmurs rubs or gallops appreciated  ABDOMINAL: No apparent ascites.  EXTREMITIES: No peripheral edema  HEMATOLOGIC: No significant bruising NEUROLOGIC: Oriented to person, place, and time. Nonfocal. Normal muscle tone.  PSYCHIATRIC: Normal mood and affect. Normal behavior. Cooperative  Lab Results: Hematology Recent Labs  Lab 07/09/20 1934 07/10/20 0426  WBC 5.8 5.3  RBC 4.65 4.47  HGB 13.6 13.5  HCT 42.5 40.1  MCV 91.4 89.7  MCH 29.2 30.2  MCHC 32.0 33.7  RDW 12.8 12.7  PLT 136* 122*    Chemistry Recent Labs  Lab 07/09/20 1934 07/10/20 0426  NA 136 137  K 3.9 3.5  CL 101 102  CO2 27 26  GLUCOSE 94 89  BUN 26* 19  CREATININE 0.91 0.67  CALCIUM 8.8* 8.8*  GFRNONAA >60 >60  ANIONGAP 8 9     Cardiac Enzymes: Cardiac Panel (last 3 results) Recent Labs    07/09/20 2151 07/10/20 0426 07/10/20 0705  TROPONINIHS 6 6 6     BNP (last 3 results) Recent Labs    07/09/20 1934  BNP 83.5    ProBNP (last 3 results) No results for input(s): PROBNP in the last 8760 hours.  DDimer  Recent Labs  Lab 07/09/20 2026  DDIMER 0.57*     Hemoglobin A1c: No results found for: HGBA1C, MPG  TSH  Recent Labs    07/09/20 2026  TSH 3.541    Lipid Panel No results found for: CHOL, TRIG, HDL, CHOLHDL, VLDL, LDLCALC, LDLDIRECT  Imaging: DG Chest Portable 1 View  Result Date: 07/09/2020 CLINICAL DATA:  Exertional shortness of breath. Dizziness for the past hour. Tachycardia. EXAM: PORTABLE CHEST 1 VIEW COMPARISON:  None. FINDINGS: Mildly enlarged cardiac silhouette. Clear lungs with normal vascularity. Mild lower thoracic spine degenerative change. IMPRESSION: Mild cardiomegaly. No acute abnormality. Electronically Signed   By: Beckie SaltsSteven  Reid M.D.   On: 07/09/2020 20:07   ECHOCARDIOGRAM COMPLETE  Result Date: 07/10/2020    ECHOCARDIOGRAM REPORT   Patient Name:   Marco KinsmanMICHAEL White Date of Exam: 07/10/2020  Medical Rec #:  [id number]     Height:       71.0 in Accession #:    [id number]    Weight:       153.9 lb Date of Birth:  Sep 09, 1955     BSA:          1.886 m Patient Age:    64 years      BP:           91/58 mmHg Patient Gender: M             HR:           70 bpm. Exam Location:  Inpatient Procedure: 2D Echo, Cardiac Doppler and Color Doppler Indications:     I48.92* Unspecified atrial flutter  History:         Patient has no prior history of Echocardiogram examinations.                  Abnormal ECG, Arrythmias:Atrial Flutter;                  Signs/Symptoms:Shortness of Breath and Dyspnea.  Sonographer:     Sheralyn Boatmanina West RDCS Referring Phys:  [id number] Tessa LernerSUNIT [place] Diagnosing Phys: Tessa LernerSunit Eleora Sutherland DO  Sonographer Comments: Technically difficult study due to poor echo windows and suboptimal apical window. Image acquisition challenging due to respiratory motion. Extremely difficult apical views. IMPRESSIONS  1. Left ventricular ejection fraction, by estimation, is 50 to 55%. The left ventricle has low normal function. The left ventricle has no regional wall motion abnormalities. Diastolic dysfunction is not assessed due to atrial flutter.  2. Right ventricular systolic function is mildly reduced. The right ventricular size is mildly enlarged. There is normal pulmonary artery systolic pressure.  3. Right atrial size was mildly dilated.  4. The mitral valve is normal in structure. No evidence of mitral valve regurgitation. No evidence of mitral stenosis.  5. The aortic valve is tricuspid. Aortic valve regurgitation is mild to moderate. No aortic stenosis is present.  6. The inferior vena cava is dilated in size with >50% respiratory variability, suggesting right atrial pressure of 8 mmHg. FINDINGS  Left Ventricle: Left ventricular ejection fraction, by estimation, is 50 to 55%. The left ventricle has low normal function. The left ventricle has no regional wall motion abnormalities. The left ventricular internal cavity size was  normal in size. There is no left ventricular hypertrophy. Diastolic dysfunction is not assessed due to atrial flutter. Right Ventricle: The right ventricular size is mildly enlarged. No increase in right ventricular wall thickness. Right ventricular systolic function is mildly reduced. There is normal pulmonary artery systolic pressure. The tricuspid regurgitant velocity  is 1.68 m/s, and with an assumed right atrial pressure of 8 mmHg, the estimated right ventricular systolic pressure is 19.3 mmHg. Left Atrium: Left atrial size was normal in size. Right Atrium: Right atrial size was mildly dilated. Pericardium: There is no evidence of pericardial effusion. Mitral Valve: The mitral valve is normal in structure.  No evidence of mitral valve regurgitation. No evidence of mitral valve stenosis. Tricuspid Valve: The tricuspid valve is normal in structure. Tricuspid valve regurgitation is trivial. No evidence of tricuspid stenosis. Aortic Valve: The aortic valve is tricuspid. Aortic valve regurgitation is mild to moderate. Aortic regurgitation PHT measures 606 msec. No aortic stenosis is present. Pulmonic Valve: The pulmonic valve was normal in structure. Pulmonic valve regurgitation is trivial. Aorta: The aortic root and ascending aorta are structurally normal, with no evidence of dilitation. Venous: The inferior vena cava is dilated in size with greater than 50% respiratory variability, suggesting right atrial pressure of 8 mmHg. IAS/Shunts: The atrial septum is grossly normal.  LEFT VENTRICLE PLAX 2D LVIDd:         4.60 cm LVIDs:         3.20 cm LV PW:         1.00 cm LV IVS:        0.90 cm LVOT diam:     2.70 cm LV SV:         74 LV SV Index:   39 LVOT Area:     5.73 cm  LV Volumes (MOD) LV vol d, MOD A4C: 78.5 ml LV vol s, MOD A4C: 26.8 ml LV SV MOD A4C:     78.5 ml RIGHT VENTRICLE            IVC RV S prime:     9.46 cm/s  IVC diam: 2.30 cm TAPSE (M-mode): 3.1 cm LEFT ATRIUM           Index       RIGHT ATRIUM            Index LA diam:      2.50 cm 1.33 cm/m  RA Area:     22.10 cm LA Vol (A4C): 34.1 ml 18.08 ml/m RA Volume:   64.50 ml  34.19 ml/m  AORTIC VALVE LVOT Vmax:   61.50 cm/s LVOT Vmean:  39.900 cm/s LVOT VTI:    0.129 m AI PHT:      606 msec  AORTA Ao Root diam: 3.50 cm MITRAL VALVE                TRICUSPID VALVE MV Area (PHT): 3.85 cm     TR Peak grad:   11.3 mmHg MV Decel Time: 197 msec     TR Vmax:        168.00 cm/s MV E velocity: 105.50 cm/s                             SHUNTS                             Systemic VTI:  0.13 m                             Systemic Diam: 2.70 cm Marco Rumore DO Electronically signed by Tessa Lerner DO Signature Date/Time: 07/10/2020/1:55:48 PM    Final     Cardiac database: EKG: 07/10/2020: Atrial flutter with 4:1 conduction with a heart rate of 69 bpm, consider inferior infarct, without underlying injury pattern.  07/10/2020: Atrial flutter 4: 1 conduction with a heart rate of 70 bpm, consider old inferior infarct.  Echocardiogram: 07/10/2020: 1. Left ventricular ejection fraction, by estimation, is 50 to 55%. The  left ventricle has low normal  function. The left ventricle has no regional  wall motion abnormalities. Diastolic dysfunction is not assessed due to  atrial flutter.  2. Right ventricular systolic function is mildly reduced. The right  ventricular size is mildly enlarged. There is normal pulmonary artery  systolic pressure.  3. Right atrial size was mildly dilated.  4. The mitral valve is normal in structure. No evidence of mitral valve  regurgitation. No evidence of mitral stenosis.  5. The aortic valve is tricuspid. Aortic valve regurgitation is mild to  moderate. No aortic stenosis is present.  6. The inferior vena cava is dilated in size with >50% respiratory  variability, suggesting right atrial pressure of 8 mmHg.   Stress test: None  Heart catheterization: None  Scheduled Meds: . metoprolol tartrate  12.5 mg Oral BID  . sodium  chloride flush  3 mL Intravenous Q12H    Continuous Infusions: . sodium chloride    . diltiazem (CARDIZEM) infusion 5 mg/hr (07/10/20 0229)  . heparin 1,050 Units/hr (07/10/20 1533)    PRN Meds:    IMPRESSION & RECOMMENDATIONS: Marco White is a 65 y.o. male whose past medical history and cardiac risk factors include: Atrial flutter, history of anemia.  Impression: Atrial flutter with rapid ventricular rate: Now rate controlled Mild to moderate aortic regurgitation. History of anemia.  Plan: Patient currently on Lopressor 25 mg p.o. twice daily.  Patient's ventricular rate has improved.  However his blood pressures remain soft.  We will decrease the Lopressor 12.5 mg p.o. twice daily. We will start 0.9 normal saline at 75 cc an hour for the next 24 hours.  High sensitive troponin negative x4, BNP within normal limits, TSH within normal limits.  D-dimers are within normal limits when corrected for age, troponins are negative x4, BNP within normal limits low probability of pulmonary embolism.  Nurse is informed have him ambulate on the floors to see if he is symptomatic given his underlying atrial flutter.  Continue telemetry.  Echocardiogram results discussed with the patient.  Recommend ischemic evaluation as outpatient given the new onset of atrial flutter.  Had a long discussion with the patient and his wife Dr. Vickey Huger (who was present on speaker phone) with regards to restoration of normal sinus rhythm.  We discussed rate control strategy, undergoing TEE directed cardioversion, and possibly considering antiarrhythmic medications.  Patient would like to continue with rate control strategy for another 24 hours to see if that restores normal sinus rhythm.  But I informed him that given the fact that he is probably been in atrial flutter for the last several weeks he most likely will need TEE guided cardioversion.  Discussed the risks, benefits, and alternatives of TEE and  cardioversion.  Patient is tentatively scheduled for TEE guided cardioversion on 07/12/2020. This will be performed at [facility].  He understands that he will need to be on oral anticoagulation for 4 weeks post cardioversion.  His CHA2DS2-VASc score is 0 and therefore will not require long-term oral anticoagulation.  Patient's questions and concerns were addressed to his satisfaction. He voices understanding of the instructions provided during this encounter.   This note was created using a voice recognition software as a result there may be grammatical errors inadvertently enclosed that do not reflect the nature of this encounter. Every attempt is made to correct such errors.  Total time spent greater than 35 minutes.  Plan of care discussed with the patient, his wife over the phone, and nursing staff.  Emberlynn Riggan [place], DO, [facility]  [facility] Office: [phone] 07/10/2020, 4:51 PM

## 2020-07-11 LAB — BASIC METABOLIC PANEL
Anion gap: 6 (ref 5–15)
BUN: 26 mg/dL — ABNORMAL HIGH (ref 8–23)
CO2: 27 mmol/L (ref 22–32)
Calcium: 9 mg/dL (ref 8.9–10.3)
Chloride: 105 mmol/L (ref 98–111)
Creatinine, Ser: 0.94 mg/dL (ref 0.61–1.24)
GFR, Estimated: >60 mL/min (ref >60)
Glucose, Bld: 89 mg/dL (ref 70–99)
Potassium: 3.8 mmol/L (ref 3.5–5.1)
Sodium: 138 mmol/L (ref 135–145)

## 2020-07-11 LAB — HEMOGLOBIN A1C
Hgb A1c MFr Bld: 5.8 % — ABNORMAL HIGH (ref 4.8–5.6)
Mean Plasma Glucose: 119.76 mg/dL

## 2020-07-11 LAB — CBC
HCT: 41.5 % (ref 39.0–52.0)
Hemoglobin: 13.4 g/dL (ref 13.0–17.0)
MCH: 29.1 pg (ref 26.0–34.0)
MCHC: 32.3 g/dL (ref 30.0–36.0)
MCV: 90 fL (ref 80.0–100.0)
Platelets: 117 10*3/uL — ABNORMAL LOW (ref 150–400)
RBC: 4.61 MIL/uL (ref 4.22–5.81)
RDW: 12.6 % (ref 11.5–15.5)
WBC: 5 10*3/uL (ref 4.0–10.5)
nRBC: 0 % (ref 0.0–0.2)

## 2020-07-11 LAB — HEPARIN LEVEL (UNFRACTIONATED)
Heparin Unfractionated: 0.3 IU/mL (ref 0.30–0.70)
Heparin Unfractionated: 0.43 IU/mL (ref 0.30–0.70)
Heparin Unfractionated: 0.43 IU/mL (ref 0.30–0.70)

## 2020-07-11 LAB — MAGNESIUM: Magnesium: 2 mg/dL (ref 1.7–2.4)

## 2020-07-11 MED ORDER — DIGOXIN 0.25 MG/ML IJ SOLN
0.5000 mg | Freq: Once | INTRAMUSCULAR | Status: AC
  Administered 2020-07-11: 0.5 mg via INTRAVENOUS
  Filled 2020-07-11 (×2): qty 2

## 2020-07-11 NOTE — Progress Notes (Signed)
Progress Note  Patient Name: Marco White Date of Encounter: 07/11/2020  Attending physician: Tessa Lerner, DO Primary care provider: Pcp, No Primary Cardiologist: NA Consultant:Alfonsa Vaile Odis Hollingshead, DO  Subjective: Marco White is a 65 y.o. male who was seen and examined at bedside at approximately 830am. No events overnight. Patient denies any chest pain or shortness of breath at rest or with effort late activities. Denies orthopnea, paroxysmal nocturnal dyspnea or lower extremity swelling. Still gets tachycardic w/ ambulation.  Case discussed and reviewed with his nurse.  Objective: Vital Signs in the last 24 hours: Temp:  [97.8 F (36.6 C)-98.3 F (36.8 C)] 97.8 F (36.6 C) (04/11 0340) Pulse Rate:  [68-135] 72 (04/11 0844) Resp:  [16-20] 18 (04/11 0844) BP: (93-108)/(50-76) 99/59 (04/11 0844) SpO2:  [97 %-100 %] 100 % (04/11 0844) Weight:  [68.7 kg] 68.7 kg (04/11 0500)  Intake/Output:  Intake/Output Summary (Last 24 hours) at 07/11/2020 0853 Last data filed at 07/11/2020 0602 Gross per 24 hour  Intake 1480.05 ml  Output 1200 ml  Net 280.05 ml    Net IO Since Admission: -418.82 mL [07/11/20 0853]  Weights:  Filed Weights   07/09/20 1926 07/10/20 0451 07/11/20 0500  Weight: 68 kg 69.8 kg 68.7 kg    Telemetry: Personally reviewed-atrial flutter controlled ventricular rate  Physical examination: PHYSICAL EXAM: Vitals with BMI 07/11/2020 07/11/2020 07/11/2020  Height - - -  Weight - 151 lbs 7 oz -  BMI - 21.13 -  Systolic 99 - 94  Diastolic 59 - 66  Pulse 72 - 68    CONSTITUTIONAL: Well-developed and well-nourished. No acute distress.  SKIN: Skin is warm and dry. No rash noted. No cyanosis. No pallor. No jaundice HEAD: Normocephalic and atraumatic.  EYES: No scleral icterus MOUTH/THROAT: Moist oral membranes.  NECK: No JVD present. No thyromegaly noted. No carotid bruits  LYMPHATIC: No visible cervical adenopathy.  CHEST Normal respiratory effort. No intercostal  retractions  LUNGS: Clear to auscultation bilaterally.  No stridor. No wheezes. No rales.  CARDIOVASCULAR: Irregularly, variable S1-S2, no murmurs rubs or gallops appreciated  ABDOMINAL: No apparent ascites.  EXTREMITIES: No peripheral edema  HEMATOLOGIC: No significant bruising NEUROLOGIC: Oriented to person, place, and time. Nonfocal. Normal muscle tone.  PSYCHIATRIC: Normal mood and affect. Normal behavior. Cooperative  Lab Results: Hematology Recent Labs  Lab 07/09/20 1934 07/10/20 0426 07/11/20 0457  WBC 5.8 5.3 5.0  RBC 4.65 4.47 4.61  HGB 13.6 13.5 13.4  HCT 42.5 40.1 41.5  MCV 91.4 89.7 90.0  MCH 29.2 30.2 29.1  MCHC 32.0 33.7 32.3  RDW 12.8 12.7 12.6  PLT 136* 122* 117*    Chemistry Recent Labs  Lab 07/09/20 1934 07/10/20 0426 07/11/20 0457  NA 136 137 138  K 3.9 3.5 3.8  CL 101 102 105  CO2 27 26 27   GLUCOSE 94 89 89  BUN 26* 19 26*  CREATININE 0.91 0.67 0.94  CALCIUM 8.8* 8.8* 9.0  GFRNONAA >60 >60 >60  ANIONGAP 8 9 6      Cardiac Enzymes: Cardiac Panel (last 3 results) Recent Labs    07/09/20 2151 07/10/20 0426 07/10/20 0705  TROPONINIHS 6 6 6     BNP (last 3 results) Recent Labs    07/09/20 1934  BNP 83.5    ProBNP (last 3 results) No results for input(s): PROBNP in the last 8760 hours.  DDimer  Recent Labs  Lab 07/09/20 2026  DDIMER 0.57*     Hemoglobin A1c:  Lab Results  Component Value Date  HGBA1C 5.8 (H) 07/10/2020   MPG 119.76 07/10/2020    TSH  Recent Labs    07/09/20 2026  TSH 3.541    Lipid Panel No results found for: CHOL, TRIG, HDL, CHOLHDL, VLDL, LDLCALC, LDLDIRECT  Imaging: DG Chest Portable 1 View  Result Date: 07/09/2020 CLINICAL DATA:  Exertional shortness of breath. Dizziness for the past hour. Tachycardia. EXAM: PORTABLE CHEST 1 VIEW COMPARISON:  None. FINDINGS: Mildly enlarged cardiac silhouette. Clear lungs with normal vascularity. Mild lower thoracic spine degenerative change. IMPRESSION: Mild  cardiomegaly. No acute abnormality. Electronically Signed   By: Beckie Salts M.D.   On: 07/09/2020 20:07   ECHOCARDIOGRAM COMPLETE  Result Date: 07/10/2020    ECHOCARDIOGRAM REPORT   Patient Name:   Marco White Date of Exam: 07/10/2020 Medical Rec #:  [id number]     Height:       71.0 in Accession #:    [id number]    Weight:       153.9 lb Date of Birth:  01-19-1956     BSA:          1.886 m Patient Age:    64 years      BP:           91/58 mmHg Patient Gender: M             HR:           70 bpm. Exam Location:  Inpatient Procedure: 2D Echo, Cardiac Doppler and Color Doppler Indications:     I48.92* Unspecified atrial flutter  History:         Patient has no prior history of Echocardiogram examinations.                  Abnormal ECG, Arrythmias:Atrial Flutter;                  Signs/Symptoms:Shortness of Breath and Dyspnea.  Sonographer:     Sheralyn Boatman RDCS Referring Phys:  [id number] Tessa Lerner Diagnosing Phys: Tessa Lerner DO  Sonographer Comments: Technically difficult study due to poor echo windows and suboptimal apical window. Image acquisition challenging due to respiratory motion. Extremely difficult apical views. IMPRESSIONS  1. Left ventricular ejection fraction, by estimation, is 50 to 55%. The left ventricle has low normal function. The left ventricle has no regional wall motion abnormalities. Diastolic dysfunction is not assessed due to atrial flutter.  2. Right ventricular systolic function is mildly reduced. The right ventricular size is mildly enlarged. There is normal pulmonary artery systolic pressure.  3. Right atrial size was mildly dilated.  4. The mitral valve is normal in structure. No evidence of mitral valve regurgitation. No evidence of mitral stenosis.  5. The aortic valve is tricuspid. Aortic valve regurgitation is mild to moderate. No aortic stenosis is present.  6. The inferior vena cava is dilated in size with >50% respiratory variability, suggesting right atrial pressure of 8 mmHg.  FINDINGS  Left Ventricle: Left ventricular ejection fraction, by estimation, is 50 to 55%. The left ventricle has low normal function. The left ventricle has no regional wall motion abnormalities. The left ventricular internal cavity size was normal in size. There is no left ventricular hypertrophy. Diastolic dysfunction is not assessed due to atrial flutter. Right Ventricle: The right ventricular size is mildly enlarged. No increase in right ventricular wall thickness. Right ventricular systolic function is mildly reduced. There is normal pulmonary artery systolic pressure. The tricuspid regurgitant velocity  is 1.68 m/s, and with an assumed right  atrial pressure of 8 mmHg, the estimated right ventricular systolic pressure is 19.3 mmHg. Left Atrium: Left atrial size was normal in size. Right Atrium: Right atrial size was mildly dilated. Pericardium: There is no evidence of pericardial effusion. Mitral Valve: The mitral valve is normal in structure. No evidence of mitral valve regurgitation. No evidence of mitral valve stenosis. Tricuspid Valve: The tricuspid valve is normal in structure. Tricuspid valve regurgitation is trivial. No evidence of tricuspid stenosis. Aortic Valve: The aortic valve is tricuspid. Aortic valve regurgitation is mild to moderate. Aortic regurgitation PHT measures 606 msec. No aortic stenosis is present. Pulmonic Valve: The pulmonic valve was normal in structure. Pulmonic valve regurgitation is trivial. Aorta: The aortic root and ascending aorta are structurally normal, with no evidence of dilitation. Venous: The inferior vena cava is dilated in size with greater than 50% respiratory variability, suggesting right atrial pressure of 8 mmHg. IAS/Shunts: The atrial septum is grossly normal.  LEFT VENTRICLE PLAX 2D LVIDd:         4.60 cm LVIDs:         3.20 cm LV PW:         1.00 cm LV IVS:        0.90 cm LVOT diam:     2.70 cm LV SV:         74 LV SV Index:   39 LVOT Area:     5.73 cm  LV  Volumes (MOD) LV vol d, MOD A4C: 78.5 ml LV vol s, MOD A4C: 26.8 ml LV SV MOD A4C:     78.5 ml RIGHT VENTRICLE            IVC RV S prime:     9.46 cm/s  IVC diam: 2.30 cm TAPSE (M-mode): 3.1 cm LEFT ATRIUM           Index       RIGHT ATRIUM           Index LA diam:      2.50 cm 1.33 cm/m  RA Area:     22.10 cm LA Vol (A4C): 34.1 ml 18.08 ml/m RA Volume:   64.50 ml  34.19 ml/m  AORTIC VALVE LVOT Vmax:   61.50 cm/s LVOT Vmean:  39.900 cm/s LVOT VTI:    0.129 m AI PHT:      606 msec  AORTA Ao Root diam: 3.50 cm MITRAL VALVE                TRICUSPID VALVE MV Area (PHT): 3.85 cm     TR Peak grad:   11.3 mmHg MV Decel Time: 197 msec     TR Vmax:        168.00 cm/s MV E velocity: 105.50 cm/s                             SHUNTS                             Systemic VTI:  0.13 m                             Systemic Diam: 2.70 cm Jaimey Franchini DO Electronically signed by Annibelle Brazie DO Signature Date/Time: 07/10/2020/1:55:48 PM    Final     Cardiac database: EKG: 07/10/2020: Atrial flutter with 4:1 conduction with a heart rate of   69 bpm, consider inferior infarct, without underlying injury pattern.  07/10/2020: Atrial flutter 4: 1 conduction with a heart rate of 70 bpm, consider old inferior infarct.  Echocardiogram: 07/10/2020: 1. Left ventricular ejection fraction, by estimation, is 50 to 55%. The  left ventricle has low normal function. The left ventricle has no regional  wall motion abnormalities. Diastolic dysfunction is not assessed due to  atrial flutter.  2. Right ventricular systolic function is mildly reduced. The right  ventricular size is mildly enlarged. There is normal pulmonary artery  systolic pressure.  3. Right atrial size was mildly dilated.  4. The mitral valve is normal in structure. No evidence of mitral valve  regurgitation. No evidence of mitral stenosis.  5. The aortic valve is tricuspid. Aortic valve regurgitation is mild to  moderate. No aortic stenosis is present.  6. The  inferior vena cava is dilated in size with >50% respiratory  variability, suggesting right atrial pressure of 8 mmHg.   Stress test: None  Heart catheterization: None  Scheduled Meds: . metoprolol tartrate  12.5 mg Oral BID  . sodium chloride flush  3 mL Intravenous Q12H    Continuous Infusions: . sodium chloride 75 mL/hr at 07/11/20 0615  . diltiazem (CARDIZEM) infusion 5 mg/hr (07/10/20 0229)  . heparin 1,100 Units/hr (07/11/20 0037)    PRN Meds:    IMPRESSION & RECOMMENDATIONS: Marco White is a 65 y.o. male whose past medical history and cardiac risk factors include: Atrial flutter, history of anemia.  Impression: Atrial flutter with rapid ventricular rate: Now rate controlled Mild to moderate aortic regurgitation. Prediabetes (a1c 5.8) History of anemia.  Plan: Continue BB at the current dose; holding off uptitrating the dose due to soft BP.  Remains symptomatic w/ ambulation.  Continue 0.9 NS but will increase to 100cc/hr for 12 hrs.   High sensitive troponin negative x4, BNP within normal limits, TSH within normal limits.  Continue telemetry.  Echocardiogram results discussed with the patient.  Recommend ischemic evaluation as outpatient given the new onset of atrial flutter.  TEE w/ cardioversion tomorrow at [facility] Had a long discussion with the patient and his wife Dr. Vickey Huger (who was present on speaker phone) with regards to restoration of normal sinus rhythm on 07/10/20.  We discussed rate control strategy, undergoing TEE directed cardioversion, and possibly considering antiarrhythmic medications.  Patient would like to continue with rate control strategy for another 24 hours to see if that restores normal sinus rhythm.  But I informed him that given the fact that he is probably been in atrial flutter for the last several weeks he most likely will need TEE guided cardioversion.  Discussed the risks, benefits, and alternatives of TEE and  cardioversion.  He understands that he will need to be on oral anticoagulation for 4 weeks post cardioversion.  His CHA2DS2-VASc score is 0 and therefore will not require long-term oral anticoagulation.  Patient's questions and concerns were addressed to his satisfaction. He voices understanding of the instructions provided during this encounter.   This note was created using a voice recognition software as a result there may be grammatical errors inadvertently enclosed that do not reflect the nature of this encounter. Every attempt is made to correct such errors.  Total time spent 35 minutes.  Plan of care discussed with the patient, his wife over the phone, and nursing staff.  Tessa Lerner, DO, [facility] [place] Office: [phone] 07/11/2020, 8:53 AM

## 2020-07-11 NOTE — Plan of Care (Signed)
  Problem: Education: Goal: Knowledge of General Education information will improve Description: Including pain rating scale, medication(s)/side effects and non-pharmacologic comfort measures Outcome: Progressing   Problem: Activity: Goal: Risk for activity intolerance will decrease Outcome: Progressing   Problem: Coping: Goal: Level of anxiety will decrease Outcome: Progressing   Problem: Pain Managment: Goal: General experience of comfort will improve Outcome: Progressing   Problem: Safety: Goal: Ability to remain free from injury will improve Outcome: Progressing   Problem: Education: Goal: Understanding of medication regimen will improve Outcome: Progressing   Problem: Activity: Goal: Ability to tolerate increased activity will improve Outcome: Progressing

## 2020-07-11 NOTE — Progress Notes (Signed)
   07/11/20 1300  Assess: MEWS Score  Temp 98.6 F (37 C)  BP (!) 92/56  Pulse Rate (!) 106  Resp 18  Level of Consciousness Alert  SpO2 98 %  O2 Device Room Air;HFNC  Assess: MEWS Score  MEWS Temp 0  MEWS Systolic 1  MEWS Pulse 1  MEWS RR 0  MEWS LOC 0  MEWS Score 2  MEWS Score Color Yellow  Assess: if the MEWS score is Yellow or Red  Were vital signs taken at a resting state? Yes  Focused Assessment No change from prior assessment  Early Detection of Sepsis Score *See Row Information* Low  MEWS guidelines implemented *See Row Information* No, previously yellow, continue vital signs every 4 hours  Treat  Pain Scale 0-10  Pain Score 0  Notify: Charge Nurse/RN  Name of Charge Nurse/RN Notified Marissa Long RN  Date Charge Nurse/RN Notified 07/11/20  Time Charge Nurse/RN Notified 1350  Notify: Provider  Provider Name/Title Dr. Odis Hollingshead  Date Provider Notified 07/11/20  Time Provider Notified 1349  Notification Type Call (call then paged and then secure chat)  Notification Reason Other (Comment) (update regarding BP and Pulse)  Provider response No new orders  Date of Provider Response 07/11/20  Time of Provider Response 1350

## 2020-07-11 NOTE — Progress Notes (Signed)
   07/11/20 1021  Assess: MEWS Score  Temp 97.8 F (36.6 C)  BP 93/63  Pulse Rate (!) 134  Resp 20  Level of Consciousness Alert  SpO2 99 %  O2 Device Room Air  Assess: MEWS Score  MEWS Temp 0  MEWS Systolic 1  MEWS Pulse 3  MEWS RR 0  MEWS LOC 0  MEWS Score 4  MEWS Score Color Red  Treat  Pain Scale 0-10  Pain Score 0  Take Vital Signs  Increase Vital Sign Frequency  Red: Q 1hr X 4 then Q 4hr X 4, if remains red, continue Q 4hrs  Notify: Charge Nurse/RN  Name of Charge Nurse/RN Notified Marissa Long RN  Date Charge Nurse/RN Notified 07/11/20  Time Charge Nurse/RN Notified 1030  Notify: Provider  Provider Name/Title Dr. Odis Hollingshead  Date Provider Notified 07/11/20  Time Provider Notified 1030  Notification Type Call  Notification Reason Change in status (Increased heart rate)  Provider response Other (Comment) (new orders to be placed)  Date of Provider Response 07/11/20  Time of Provider Response 1053

## 2020-07-11 NOTE — Progress Notes (Signed)
   07/10/20 1943  Assess: MEWS Score  BP 108/76  Pulse Rate (!) 135  Resp 20  Level of Consciousness Alert  SpO2 100 %  O2 Device Room Air  Assess: MEWS Score  MEWS Temp 0  MEWS Systolic 0  MEWS Pulse 3  MEWS RR 0  MEWS LOC 0  MEWS Score 3  MEWS Score Color Yellow  Assess: if the MEWS score is Yellow or Red  Were vital signs taken at a resting state? Yes  Focused Assessment No change from prior assessment  Early Detection of Sepsis Score *See Row Information* Low  MEWS guidelines implemented *See Row Information* Yes  Treat  MEWS Interventions Administered scheduled meds/treatments  Pain Scale 0-10  Pain Score 0  Take Vital Signs  Increase Vital Sign Frequency  Yellow: Q 2hr X 2 then Q 4hr X 2, if remains yellow, continue Q 4hrs  Escalate  MEWS: Escalate Yellow: discuss with charge nurse/RN and consider discussing with provider and RRT  Notify: Charge Nurse/RN  Name of Charge Nurse/RN Notified Hilda, RN  Date Charge Nurse/RN Notified 07/10/20  Time Charge Nurse/RN Notified 1943

## 2020-07-11 NOTE — Progress Notes (Addendum)
ANTICOAGULATION CONSULT NOTE - Follow Up Consult  Pharmacy Consult for Heparin Indication: atrial fibrillation  No Known Allergies  Patient Measurements: Height: 5\' 11"  (180.3 cm) Weight: 68.7 kg (151 lb 7.3 oz) IBW/kg (Calculated) : 75.3 Heparin Dosing Weight: TBW  Vital Signs: Temp: 97.8 F (36.6 C) (04/11 0340) Temp Source: Oral (04/11 0340) BP: 94/66 (04/11 0340) Pulse Rate: 68 (04/11 0340)  Labs: Recent Labs    07/09/20 1934 07/09/20 2151 07/09/20 2216 07/10/20 0426 07/10/20 0705 07/10/20 0705 07/10/20 1229 07/10/20 2311 07/11/20 0457  HGB 13.6  --   --  13.5  --   --   --   --  13.4  HCT 42.5  --   --  40.1  --   --   --   --  41.5  PLT 136*  --   --  122*  --   --   --   --  117*  APTT  --   --  29  --   --   --   --   --   --   LABPROT  --   --  13.6  --   --   --   --   --   --   INR  --   --  1.1  --   --   --   --   --   --   HEPARINUNFRC  --   --   --   --  0.32   < > 0.30 0.30 0.43  CREATININE 0.91  --   --  0.67  --   --   --   --  0.94  TROPONINIHS 6 6  --  6 6  --   --   --   --    < > = values in this interval not displayed.    Estimated Creatinine Clearance: 79.2 mL/min (by C-G formula based on SCr of 0.94 mg/dL).   Medications:  Infusions:  . sodium chloride 75 mL/hr at 07/11/20 0615  . diltiazem (CARDIZEM) infusion 5 mg/hr (07/10/20 0229)  . heparin 1,100 Units/hr (07/11/20 0037)    Assessment: Pt is a 20 yoM presenting with SOB, tachycardia. PMH significant for anemia. Cardiology consulted - planning for possible TEE guided cardioversion on 4/12.  Pharmacy is consulted to dose heparin drip for atrial flutter/afib.  Today, 07/11/2020:  Heparin level 0.43, therapeutic on heparin 1100 units/hr  CBC: Hgb stable/WNL, Plt low/stable at 117k  No s/s bleeding or complications reported.   Goal of Therapy:  Heparin level 0.3-0.7 units/ml Monitor platelets by anticoagulation protocol: Yes   Plan:   Continue heparin IV infusion at 1100  units/hr  Heparin level in 8 hours to confirm therapeutic rate  Daily heparin level and CBC  Follow up plans for cardioversion, oral anticoagulation   09/10/2020 PharmD, BCPS Clinical Pharmacist [facility] main pharmacy [phone] 07/11/2020 7:51 AM     Addendum:   Confirmatory heparin level remains therapeutic at 0.43 Follow up daily heparin levels.  09/10/2020 PharmD, BCPS Clinical Pharmacist [facility] main pharmacy [phone] 07/11/2020 1:48 PM

## 2020-07-11 NOTE — Progress Notes (Signed)
Brief Pharmacy Anti-Coagulation Note:  Pt is a 33 yoM presenting with SOB, tachycardia. PMH significant for anemia. Cardiology consulted - initiated heparin drip for atrial flutter/afib, pharmacy to dose. Assessment:  Confirmatory HL = 0.30 is on lower end of therapeutic range on heparin infusion of 1050 units/hr  Confirmed with RN that heparin infusing at correct rate. No signs of bleeding. No interruptions  Goal: HL 0.3 - 0.7  Plan:  Slightly increase heparin infusion to 1100 units/hr  HL with am labs  Daily CBC  Arley Phenix RPh 07/11/2020, 12:35 AM

## 2020-07-11 NOTE — H&P (View-Only) (Signed)
Progress Note  Patient Name: Marco White Date of Encounter: 07/11/2020  Attending physician: Tessa Lerner, DO Primary care provider: Pcp, No Primary Cardiologist: NA Consultant:Garrett Mitchum Odis Hollingshead, DO  Subjective: Marco White is a 65 y.o. male who was seen and examined at bedside at approximately 830am. No events overnight. Patient denies any chest pain or shortness of breath at rest or with effort late activities. Denies orthopnea, paroxysmal nocturnal dyspnea or lower extremity swelling. Still gets tachycardic w/ ambulation.  Case discussed and reviewed with his nurse.  Objective: Vital Signs in the last 24 hours: Temp:  [97.8 F (36.6 C)-98.3 F (36.8 C)] 97.8 F (36.6 C) (04/11 0340) Pulse Rate:  [68-135] 72 (04/11 0844) Resp:  [16-20] 18 (04/11 0844) BP: (93-108)/(50-76) 99/59 (04/11 0844) SpO2:  [97 %-100 %] 100 % (04/11 0844) Weight:  [68.7 kg] 68.7 kg (04/11 0500)  Intake/Output:  Intake/Output Summary (Last 24 hours) at 07/11/2020 0853 Last data filed at 07/11/2020 0602 Gross per 24 hour  Intake 1480.05 ml  Output 1200 ml  Net 280.05 ml    Net IO Since Admission: -418.82 mL [07/11/20 0853]  Weights:  Filed Weights   07/09/20 1926 07/10/20 0451 07/11/20 0500  Weight: 68 kg 69.8 kg 68.7 kg    Telemetry: Personally reviewed-atrial flutter controlled ventricular rate  Physical examination: PHYSICAL EXAM: Vitals with BMI 07/11/2020 07/11/2020 07/11/2020  Height - - -  Weight - 151 lbs 7 oz -  BMI - 21.13 -  Systolic 99 - 94  Diastolic 59 - 66  Pulse 72 - 68    CONSTITUTIONAL: Well-developed and well-nourished. No acute distress.  SKIN: Skin is warm and dry. No rash noted. No cyanosis. No pallor. No jaundice HEAD: Normocephalic and atraumatic.  EYES: No scleral icterus MOUTH/THROAT: Moist oral membranes.  NECK: No JVD present. No thyromegaly noted. No carotid bruits  LYMPHATIC: No visible cervical adenopathy.  CHEST Normal respiratory effort. No intercostal  retractions  LUNGS: Clear to auscultation bilaterally.  No stridor. No wheezes. No rales.  CARDIOVASCULAR: Irregularly, variable S1-S2, no murmurs rubs or gallops appreciated  ABDOMINAL: No apparent ascites.  EXTREMITIES: No peripheral edema  HEMATOLOGIC: No significant bruising NEUROLOGIC: Oriented to person, place, and time. Nonfocal. Normal muscle tone.  PSYCHIATRIC: Normal mood and affect. Normal behavior. Cooperative  Lab Results: Hematology Recent Labs  Lab 07/09/20 1934 07/10/20 0426 07/11/20 0457  WBC 5.8 5.3 5.0  RBC 4.65 4.47 4.61  HGB 13.6 13.5 13.4  HCT 42.5 40.1 41.5  MCV 91.4 89.7 90.0  MCH 29.2 30.2 29.1  MCHC 32.0 33.7 32.3  RDW 12.8 12.7 12.6  PLT 136* 122* 117*    Chemistry Recent Labs  Lab 07/09/20 1934 07/10/20 0426 07/11/20 0457  NA 136 137 138  K 3.9 3.5 3.8  CL 101 102 105  CO2 27 26 27   GLUCOSE 94 89 89  BUN 26* 19 26*  CREATININE 0.91 0.67 0.94  CALCIUM 8.8* 8.8* 9.0  GFRNONAA >60 >60 >60  ANIONGAP 8 9 6      Cardiac Enzymes: Cardiac Panel (last 3 results) Recent Labs    07/09/20 2151 07/10/20 0426 07/10/20 0705  TROPONINIHS 6 6 6     BNP (last 3 results) Recent Labs    07/09/20 1934  BNP 83.5    ProBNP (last 3 results) No results for input(s): PROBNP in the last 8760 hours.  DDimer  Recent Labs  Lab 07/09/20 2026  DDIMER 0.57*     Hemoglobin A1c:  Lab Results  Component Value Date  HGBA1C 5.8 (H) 07/10/2020   MPG 119.76 07/10/2020    TSH  Recent Labs    07/09/20 2026  TSH 3.541    Lipid Panel No results found for: CHOL, TRIG, HDL, CHOLHDL, VLDL, LDLCALC, LDLDIRECT  Imaging: DG Chest Portable 1 View  Result Date: 07/09/2020 CLINICAL DATA:  Exertional shortness of breath. Dizziness for the past hour. Tachycardia. EXAM: PORTABLE CHEST 1 VIEW COMPARISON:  None. FINDINGS: Mildly enlarged cardiac silhouette. Clear lungs with normal vascularity. Mild lower thoracic spine degenerative change. IMPRESSION: Mild  cardiomegaly. No acute abnormality. Electronically Signed   By: Beckie Salts M.D.   On: 07/09/2020 20:07   ECHOCARDIOGRAM COMPLETE  Result Date: 07/10/2020    ECHOCARDIOGRAM REPORT   Patient Name:   Marco White Date of Exam: 07/10/2020 Medical Rec #:  [id number]     Height:       71.0 in Accession #:    [id number]    Weight:       153.9 lb Date of Birth:  01-19-1956     BSA:          1.886 m Patient Age:    64 years      BP:           91/58 mmHg Patient Gender: M             HR:           70 bpm. Exam Location:  Inpatient Procedure: 2D Echo, Cardiac Doppler and Color Doppler Indications:     I48.92* Unspecified atrial flutter  History:         Patient has no prior history of Echocardiogram examinations.                  Abnormal ECG, Arrythmias:Atrial Flutter;                  Signs/Symptoms:Shortness of Breath and Dyspnea.  Sonographer:     Sheralyn Boatman RDCS Referring Phys:  [id number] Tessa Lerner Diagnosing Phys: Tessa Lerner DO  Sonographer Comments: Technically difficult study due to poor echo windows and suboptimal apical window. Image acquisition challenging due to respiratory motion. Extremely difficult apical views. IMPRESSIONS  1. Left ventricular ejection fraction, by estimation, is 50 to 55%. The left ventricle has low normal function. The left ventricle has no regional wall motion abnormalities. Diastolic dysfunction is not assessed due to atrial flutter.  2. Right ventricular systolic function is mildly reduced. The right ventricular size is mildly enlarged. There is normal pulmonary artery systolic pressure.  3. Right atrial size was mildly dilated.  4. The mitral valve is normal in structure. No evidence of mitral valve regurgitation. No evidence of mitral stenosis.  5. The aortic valve is tricuspid. Aortic valve regurgitation is mild to moderate. No aortic stenosis is present.  6. The inferior vena cava is dilated in size with >50% respiratory variability, suggesting right atrial pressure of 8 mmHg.  FINDINGS  Left Ventricle: Left ventricular ejection fraction, by estimation, is 50 to 55%. The left ventricle has low normal function. The left ventricle has no regional wall motion abnormalities. The left ventricular internal cavity size was normal in size. There is no left ventricular hypertrophy. Diastolic dysfunction is not assessed due to atrial flutter. Right Ventricle: The right ventricular size is mildly enlarged. No increase in right ventricular wall thickness. Right ventricular systolic function is mildly reduced. There is normal pulmonary artery systolic pressure. The tricuspid regurgitant velocity  is 1.68 m/s, and with an assumed right  atrial pressure of 8 mmHg, the estimated right ventricular systolic pressure is 19.3 mmHg. Left Atrium: Left atrial size was normal in size. Right Atrium: Right atrial size was mildly dilated. Pericardium: There is no evidence of pericardial effusion. Mitral Valve: The mitral valve is normal in structure. No evidence of mitral valve regurgitation. No evidence of mitral valve stenosis. Tricuspid Valve: The tricuspid valve is normal in structure. Tricuspid valve regurgitation is trivial. No evidence of tricuspid stenosis. Aortic Valve: The aortic valve is tricuspid. Aortic valve regurgitation is mild to moderate. Aortic regurgitation PHT measures 606 msec. No aortic stenosis is present. Pulmonic Valve: The pulmonic valve was normal in structure. Pulmonic valve regurgitation is trivial. Aorta: The aortic root and ascending aorta are structurally normal, with no evidence of dilitation. Venous: The inferior vena cava is dilated in size with greater than 50% respiratory variability, suggesting right atrial pressure of 8 mmHg. IAS/Shunts: The atrial septum is grossly normal.  LEFT VENTRICLE PLAX 2D LVIDd:         4.60 cm LVIDs:         3.20 cm LV PW:         1.00 cm LV IVS:        0.90 cm LVOT diam:     2.70 cm LV SV:         74 LV SV Index:   39 LVOT Area:     5.73 cm  LV  Volumes (MOD) LV vol d, MOD A4C: 78.5 ml LV vol s, MOD A4C: 26.8 ml LV SV MOD A4C:     78.5 ml RIGHT VENTRICLE            IVC RV S prime:     9.46 cm/s  IVC diam: 2.30 cm TAPSE (M-mode): 3.1 cm LEFT ATRIUM           Index       RIGHT ATRIUM           Index LA diam:      2.50 cm 1.33 cm/m  RA Area:     22.10 cm LA Vol (A4C): 34.1 ml 18.08 ml/m RA Volume:   64.50 ml  34.19 ml/m  AORTIC VALVE LVOT Vmax:   61.50 cm/s LVOT Vmean:  39.900 cm/s LVOT VTI:    0.129 m AI PHT:      606 msec  AORTA Ao Root diam: 3.50 cm MITRAL VALVE                TRICUSPID VALVE MV Area (PHT): 3.85 cm     TR Peak grad:   11.3 mmHg MV Decel Time: 197 msec     TR Vmax:        168.00 cm/s MV E velocity: 105.50 cm/s                             SHUNTS                             Systemic VTI:  0.13 m                             Systemic Diam: 2.70 cm Cletus Paris DO Electronically signed by Tessa LernerSunit Jenesys Casseus DO Signature Date/Time: 07/10/2020/1:55:48 PM    Final     Cardiac database: EKG: 07/10/2020: Atrial flutter with 4:1 conduction with a heart rate of  69 bpm, consider inferior infarct, without underlying injury pattern.  07/10/2020: Atrial flutter 4: 1 conduction with a heart rate of 70 bpm, consider old inferior infarct.  Echocardiogram: 07/10/2020: 1. Left ventricular ejection fraction, by estimation, is 50 to 55%. The  left ventricle has low normal function. The left ventricle has no regional  wall motion abnormalities. Diastolic dysfunction is not assessed due to  atrial flutter.  2. Right ventricular systolic function is mildly reduced. The right  ventricular size is mildly enlarged. There is normal pulmonary artery  systolic pressure.  3. Right atrial size was mildly dilated.  4. The mitral valve is normal in structure. No evidence of mitral valve  regurgitation. No evidence of mitral stenosis.  5. The aortic valve is tricuspid. Aortic valve regurgitation is mild to  moderate. No aortic stenosis is present.  6. The  inferior vena cava is dilated in size with >50% respiratory  variability, suggesting right atrial pressure of 8 mmHg.   Stress test: None  Heart catheterization: None  Scheduled Meds: . metoprolol tartrate  12.5 mg Oral BID  . sodium chloride flush  3 mL Intravenous Q12H    Continuous Infusions: . sodium chloride 75 mL/hr at 07/11/20 0615  . diltiazem (CARDIZEM) infusion 5 mg/hr (07/10/20 0229)  . heparin 1,100 Units/hr (07/11/20 0037)    PRN Meds:    IMPRESSION & RECOMMENDATIONS: Rondrick Barreira is a 65 y.o. male whose past medical history and cardiac risk factors include: Atrial flutter, history of anemia.  Impression: Atrial flutter with rapid ventricular rate: Now rate controlled Mild to moderate aortic regurgitation. Prediabetes (a1c 5.8) History of anemia.  Plan: Continue BB at the current dose; holding off uptitrating the dose due to soft BP.  Remains symptomatic w/ ambulation.  Continue 0.9 NS but will increase to 100cc/hr for 12 hrs.   High sensitive troponin negative x4, BNP within normal limits, TSH within normal limits.  Continue telemetry.  Echocardiogram results discussed with the patient.  Recommend ischemic evaluation as outpatient given the new onset of atrial flutter.  TEE w/ cardioversion tomorrow at [facility] Had a long discussion with the patient and his wife Dr. Vickey Huger (who was present on speaker phone) with regards to restoration of normal sinus rhythm on 07/10/20.  We discussed rate control strategy, undergoing TEE directed cardioversion, and possibly considering antiarrhythmic medications.  Patient would like to continue with rate control strategy for another 24 hours to see if that restores normal sinus rhythm.  But I informed him that given the fact that he is probably been in atrial flutter for the last several weeks he most likely will need TEE guided cardioversion.  Discussed the risks, benefits, and alternatives of TEE and  cardioversion.  He understands that he will need to be on oral anticoagulation for 4 weeks post cardioversion.  His CHA2DS2-VASc score is 0 and therefore will not require long-term oral anticoagulation.  Patient's questions and concerns were addressed to his satisfaction. He voices understanding of the instructions provided during this encounter.   This note was created using a voice recognition software as a result there may be grammatical errors inadvertently enclosed that do not reflect the nature of this encounter. Every attempt is made to correct such errors.  Total time spent 35 minutes.  Plan of care discussed with the patient, his wife over the phone, and nursing staff.  Tessa Lerner, DO, [facility]. [place] Office: [phone] 07/11/2020, 8:53 AM

## 2020-07-12 ENCOUNTER — Inpatient Hospital Stay (HOSPITAL_COMMUNITY): Payer: PRIVATE HEALTH INSURANCE

## 2020-07-12 ENCOUNTER — Inpatient Hospital Stay (HOSPITAL_COMMUNITY): Payer: PRIVATE HEALTH INSURANCE | Admitting: Anesthesiology

## 2020-07-12 ENCOUNTER — Encounter (HOSPITAL_COMMUNITY): Payer: Self-pay | Admitting: Cardiology

## 2020-07-12 ENCOUNTER — Encounter (HOSPITAL_COMMUNITY)
Admission: EM | Disposition: A | Discharge status: Home / Self Care | Payer: Self-pay | Source: Home / Self Care | Attending: Cardiology

## 2020-07-12 HISTORY — PX: CARDIOVERSION: SHX1299

## 2020-07-12 HISTORY — PX: TEE WITHOUT CARDIOVERSION: SHX5443

## 2020-07-12 LAB — CBC
HCT: 43.6 % (ref 39.0–52.0)
Hemoglobin: 14.3 g/dL (ref 13.0–17.0)
MCH: 29.5 pg (ref 26.0–34.0)
MCHC: 32.8 g/dL (ref 30.0–36.0)
MCV: 90.1 fL (ref 80.0–100.0)
Platelets: 118 10*3/uL — ABNORMAL LOW (ref 150–400)
RBC: 4.84 MIL/uL (ref 4.22–5.81)
RDW: 12.6 % (ref 11.5–15.5)
WBC: 4.7 10*3/uL (ref 4.0–10.5)
nRBC: 0 % (ref 0.0–0.2)

## 2020-07-12 LAB — BASIC METABOLIC PANEL
Anion gap: 5 (ref 5–15)
BUN: 17 mg/dL (ref 8–23)
CO2: 31 mmol/L (ref 22–32)
Calcium: 9.3 mg/dL (ref 8.9–10.3)
Chloride: 105 mmol/L (ref 98–111)
Creatinine, Ser: 1.07 mg/dL (ref 0.61–1.24)
GFR, Estimated: >60 mL/min (ref >60)
Glucose, Bld: 95 mg/dL (ref 70–99)
Potassium: 4.4 mmol/L (ref 3.5–5.1)
Sodium: 141 mmol/L (ref 135–145)

## 2020-07-12 LAB — MAGNESIUM: Magnesium: 2 mg/dL (ref 1.7–2.4)

## 2020-07-12 LAB — PROTIME-INR
INR: 1.1 (ref 0.8–1.2)
Prothrombin Time: 14.4 seconds (ref 11.4–15.2)

## 2020-07-12 LAB — HEPARIN LEVEL (UNFRACTIONATED): Heparin Unfractionated: 0.57 IU/mL (ref 0.30–0.70)

## 2020-07-12 SURGERY — ECHOCARDIOGRAM, TRANSESOPHAGEAL
Anesthesia: General

## 2020-07-12 MED ORDER — PROPOFOL 10 MG/ML IV BOLUS
INTRAVENOUS | Status: DC | PRN
  Administered 2020-07-12: 10 mg via INTRAVENOUS
  Administered 2020-07-12: 20 mg via INTRAVENOUS
  Administered 2020-07-12: 10 mg via INTRAVENOUS

## 2020-07-12 MED ORDER — SODIUM CHLORIDE 0.9 % IV SOLN: INTRAVENOUS | Status: DC | PRN

## 2020-07-12 MED ORDER — PROPOFOL 500 MG/50ML IV EMUL
INTRAVENOUS | Status: DC | PRN
  Administered 2020-07-12: 125 ug/kg/min via INTRAVENOUS

## 2020-07-12 MED ORDER — PHENYLEPHRINE HCL (PRESSORS) 10 MG/ML IV SOLN
INTRAVENOUS | Status: DC | PRN
  Administered 2020-07-12: 80 ug via INTRAVENOUS
  Administered 2020-07-12: 120 ug via INTRAVENOUS
  Administered 2020-07-12: 80 ug via INTRAVENOUS

## 2020-07-12 MED ORDER — APIXABAN 5 MG PO TABS
5.0000 mg | ORAL_TABLET | Freq: Two times a day (BID) | ORAL | Status: DC
  Administered 2020-07-12 – 2020-07-13 (×2): 5 mg via ORAL
  Filled 2020-07-12 (×2): qty 1

## 2020-07-12 MED ORDER — SILVER SULFADIAZINE 1 % EX CREA
TOPICAL_CREAM | Freq: Two times a day (BID) | CUTANEOUS | Status: DC
  Filled 2020-07-12: qty 85

## 2020-07-12 MED ORDER — SODIUM CHLORIDE 0.9 % IV SOLN: INTRAVENOUS | Status: AC

## 2020-07-12 MED ORDER — SODIUM CHLORIDE 0.9 % IV SOLN: INTRAVENOUS | Status: DC

## 2020-07-12 MED ORDER — GLYCOPYRROLATE 0.2 MG/ML IJ SOLN
INTRAMUSCULAR | Status: DC | PRN
  Administered 2020-07-12 (×2): .1 mg via INTRAVENOUS

## 2020-07-12 NOTE — Progress Notes (Signed)
  Echocardiogram Echocardiogram Transesophageal has been performed.  Marco White 07/12/2020, 3:38 PM

## 2020-07-12 NOTE — Progress Notes (Signed)
ANTICOAGULATION CONSULT NOTE - Follow Up Consult  Pharmacy Consult for Heparin Indication: atrial fibrillation  No Known Allergies  Patient Measurements: Height: 5\' 11"  (180.3 cm) Weight: 69.2 kg (152 lb 9.6 oz) IBW/kg (Calculated) : 75.3 Heparin Dosing Weight: TBW  Vital Signs: Temp: 97.1 F (36.2 C) (04/12 0916) Temp Source: Oral (04/12 0916) BP: 101/72 (04/12 0916) Pulse Rate: 70 (04/12 0916)  Labs: Recent Labs    07/09/20 2151 07/09/20 2216 07/10/20 0426 07/10/20 0705 07/10/20 1229 07/11/20 0457 07/11/20 1158 07/12/20 0445  HGB  --   --  13.5  --   --  13.4  --  14.3  HCT  --   --  40.1  --   --  41.5  --  43.6  PLT  --   --  122*  --   --  117*  --  118*  APTT  --  29  --   --   --   --   --   --   LABPROT  --  13.6  --   --   --   --   --   --   INR  --  1.1  --   --   --   --   --   --   HEPARINUNFRC  --   --   --  0.32   < > 0.43 0.43 0.57  CREATININE  --   --  0.67  --   --  0.94  --  1.07  TROPONINIHS 6  --  6 6  --   --   --   --    < > = values in this interval not displayed.    Estimated Creatinine Clearance: 70.1 mL/min (by C-G formula based on SCr of 1.07 mg/dL).   Medications:  Infusions:  . diltiazem (CARDIZEM) infusion Stopped (07/11/20 2245)  . heparin 1,100 Units/hr (07/11/20 1752)    Assessment: Pt is a 21 yoM presenting with SOB, tachycardia. PMH significant for anemia. Cardiology consulted - planning for possible TEE guided cardioversion on 4/12.  Pharmacy is consulted to dose heparin drip for atrial flutter/afib. Plans for anticoagulation 4 weeks post DCCV -heparin level at goal -CBC stable  Goal of Therapy:  Heparin level 0.3-0.7 units/ml Monitor platelets by anticoagulation protocol: Yes   Plan:  Continue heparin IV infusion at 1100 units/hr Daily heparin level and CBC  6/12, PharmD Clinical Pharmacist **Pharmacist phone directory can now be found on [email or web address] ([email or web address]).  Listed under [facility].

## 2020-07-12 NOTE — Anesthesia Preprocedure Evaluation (Signed)
Anesthesia Evaluation  Patient identified by MRN, date of birth, ID band Patient awake    Reviewed: Allergy & Precautions, NPO status , Patient's Chart, lab work & pertinent test results  Airway Mallampati: II  TM Distance: >3 FB Neck ROM: Full    Dental no notable dental hx.    Pulmonary neg pulmonary ROS,    Pulmonary exam normal breath sounds clear to auscultation       Cardiovascular + dysrhythmias Atrial Fibrillation + Valvular Problems/Murmurs AI  Rhythm:Regular Rate:Normal + Systolic murmurs    Neuro/Psych negative neurological ROS  negative psych ROS   GI/Hepatic negative GI ROS, Neg liver ROS,   Endo/Other  negative endocrine ROS  Renal/GU negative Renal ROS  negative genitourinary   Musculoskeletal negative musculoskeletal ROS (+)   Abdominal   Peds negative pediatric ROS (+)  Hematology negative hematology ROS (+)   Anesthesia Other Findings   Reproductive/Obstetrics negative OB ROS                             Anesthesia Physical Anesthesia Plan  ASA: III  Anesthesia Plan: General   Post-op Pain Management:    Induction: Intravenous  PONV Risk Score and Plan: 0  Airway Management Planned: Mask  Additional Equipment:   Intra-op Plan:   Post-operative Plan: Extubation in OR  Informed Consent: I have reviewed the patients History and Physical, chart, labs and discussed the procedure including the risks, benefits and alternatives for the proposed anesthesia with the patient or authorized representative who has indicated his/her understanding and acceptance.     Dental advisory given  Plan Discussed with: CRNA and Surgeon  Anesthesia Plan Comments:         Anesthesia Quick Evaluation

## 2020-07-12 NOTE — Anesthesia Postprocedure Evaluation (Signed)
Anesthesia Post Note  Patient: Marco White  Procedure(s) Performed: TRANSESOPHAGEAL ECHOCARDIOGRAM (TEE) (N/A ) CARDIOVERSION (N/A )     Patient location during evaluation: PACU Anesthesia Type: General Level of consciousness: awake and alert Pain management: pain level controlled Vital Signs Assessment: post-procedure vital signs reviewed and stable Respiratory status: spontaneous breathing, nonlabored ventilation, respiratory function stable and patient connected to nasal cannula oxygen Cardiovascular status: blood pressure returned to baseline and stable Postop Assessment: no apparent nausea or vomiting Anesthetic complications: no   No complications documented.  Last Vitals:  Vitals:   07/12/20 1509 07/12/20 1531  BP: (!) 92/55 114/72  Pulse:  67  Resp: 16 17  Temp:  36.6 C  SpO2: 97% 98%    Last Pain:  Vitals:   07/12/20 1531  TempSrc: Oral  PainSc: 0-No pain                 Levy Wellman S

## 2020-07-12 NOTE — Transfer of Care (Signed)
Immediate Anesthesia Transfer of Care Note  Patient: Marco White  Procedure(s) Performed: TRANSESOPHAGEAL ECHOCARDIOGRAM (TEE) (N/A ) CARDIOVERSION (N/A )  Patient Location: Endoscopy Unit  Anesthesia Type:MAC  Level of Consciousness: drowsy and patient cooperative  Airway & Oxygen Therapy: Patient Spontanous Breathing  Post-op Assessment: Report given to RN and Post -op Vital signs reviewed and stable  Post vital signs: Reviewed and stable  Last Vitals:  Vitals Value Taken Time  BP 87/48   Temp    Pulse 70   Resp 18   SpO2 98     Last Pain:  Vitals:   07/12/20 1347  TempSrc:   PainSc: 0-No pain      Patients Stated Pain Goal: 0 (36/62/94 7654)  Complications: No complications documented.

## 2020-07-12 NOTE — Interval H&P Note (Signed)
History and Physical Interval Note:  07/12/2020 1:57 PM  Marco White  has presented today for surgery, with the diagnosis of new onset A-flutter.  The various methods of treatment have been discussed with the patient and family. After consideration of risks, benefits and other options for treatment, the patient has consented to  Procedure(s): TRANSESOPHAGEAL ECHOCARDIOGRAM (TEE) (N/A) CARDIOVERSION (N/A) as a surgical intervention.  The patient's history has been reviewed, patient examined, no change in status, stable for surgery.  I have reviewed the patient's chart and labs.  Questions were answered to the patient's satisfaction.     Romir Klimowicz [employer]

## 2020-07-12 NOTE — Brief Op Note (Signed)
Transesophageal echocardiogram (TEE) : Preliminary report 07/12/20  Sedation: See anesthesia records.   TEE was performed without complications   LV: low -normal EF. RV: Normal structure and function. LA: Grossly normal.  Spontaneous echo contrast was not present.  No thrombus present. Left atrial appendage: Spontaneous echo contrast was not present.  No thrombus present. RA: Grossly normal. Spontaneous echo contrast was present.     MV: trivial regurgitation,no stenosis, no vegetation noted.  TV: trivial regurgitation, nostenosis, no vegetation noted. AV: Mild Regurgitation, no stenosis, no vegetation noted.   PV: Trivial regurgitation, no stenosis, no vegetation noted.   Thoracic and ascending aorta: Mild plaque.   Final report forthcoming.  Marco White [facility]  Pager: [phone] Office: [phone]  Procedure: Electrical Cardioversion Indications:  Atrial Flutter, symptomatic   Procedure Details:  Consent: Risks of procedure as well as the alternatives and risks of each were explained to the (patient/caregiver).  Consent for procedure obtained.  Time Out: Verified patient identification, verified procedure, site/side was marked, verified correct patient position, special equipment/implants available, medications/allergies/relevent history reviewed, required imaging and test results available. PERFORMED.  Patient placed on cardiac monitor, pulse oximetry, supplemental oxygen as necessary.  Sedation given: See anesthesia records.  Pacer pads placed anterior and posterior chest.  Cardioverted 2 time(s).  Cardioversion with synchronized biphasic 100J followed by 150J shock.  Evaluation: Findings: Post procedure EKG shows: NSR Complications: None Patient did tolerate procedure well.  349cc of propofol given total for TEE and cardioversion. See anesthesia records for additional details.   Marco White 07/12/2020, 2:53 PM

## 2020-07-12 NOTE — Anesthesia Procedure Notes (Signed)
Procedure Name: MAC Date/Time: 07/12/2020 2:16 PM Performed by: Kathryne Hitch, CRNA Pre-anesthesia Checklist: Patient identified, Emergency Drugs available, Suction available and Patient being monitored Patient Re-evaluated:Patient Re-evaluated prior to induction Oxygen Delivery Method: Nasal cannula Preoxygenation: Pre-oxygenation with 100% oxygen Induction Type: IV induction Placement Confirmation: positive ETCO2 Dental Injury: Teeth and Oropharynx as per pre-operative assessment

## 2020-07-13 ENCOUNTER — Other Ambulatory Visit (HOSPITAL_COMMUNITY): Payer: Self-pay

## 2020-07-13 ENCOUNTER — Encounter (HOSPITAL_COMMUNITY): Payer: Self-pay | Admitting: Cardiology

## 2020-07-13 DIAGNOSIS — D696 Thrombocytopenia, unspecified: Secondary | ICD-10-CM

## 2020-07-13 DIAGNOSIS — R7303 Prediabetes: Secondary | ICD-10-CM

## 2020-07-13 DIAGNOSIS — E785 Hyperlipidemia, unspecified: Secondary | ICD-10-CM

## 2020-07-13 DIAGNOSIS — Z862 Personal history of diseases of the blood and blood-forming organs and certain disorders involving the immune mechanism: Secondary | ICD-10-CM

## 2020-07-13 LAB — BASIC METABOLIC PANEL
Anion gap: 7 (ref 5–15)
BUN: 17 mg/dL (ref 8–23)
CO2: 26 mmol/L (ref 22–32)
Calcium: 8.8 mg/dL — ABNORMAL LOW (ref 8.9–10.3)
Chloride: 105 mmol/L (ref 98–111)
Creatinine, Ser: 0.98 mg/dL (ref 0.61–1.24)
GFR, Estimated: >60 mL/min (ref >60)
Glucose, Bld: 93 mg/dL (ref 70–99)
Potassium: 3.7 mmol/L (ref 3.5–5.1)
Sodium: 138 mmol/L (ref 135–145)

## 2020-07-13 LAB — LIPID PANEL
Cholesterol: 198 mg/dL (ref 0–200)
HDL: 50 mg/dL (ref >40)
LDL Cholesterol: 128 mg/dL — ABNORMAL HIGH (ref 0–99)
Total CHOL/HDL Ratio: 4 RATIO
Triglycerides: 102 mg/dL (ref <150)
VLDL: 20 mg/dL (ref 0–40)

## 2020-07-13 LAB — BRAIN NATRIURETIC PEPTIDE: B Natriuretic Peptide: 42 pg/mL (ref 0.0–100.0)

## 2020-07-13 LAB — CBC
HCT: 38.3 % — ABNORMAL LOW (ref 39.0–52.0)
Hemoglobin: 12.6 g/dL — ABNORMAL LOW (ref 13.0–17.0)
MCH: 29.2 pg (ref 26.0–34.0)
MCHC: 32.9 g/dL (ref 30.0–36.0)
MCV: 88.9 fL (ref 80.0–100.0)
Platelets: 117 10*3/uL — ABNORMAL LOW (ref 150–400)
RBC: 4.31 MIL/uL (ref 4.22–5.81)
RDW: 12.7 % (ref 11.5–15.5)
WBC: 7.2 10*3/uL (ref 4.0–10.5)
nRBC: 0 % (ref 0.0–0.2)

## 2020-07-13 LAB — MAGNESIUM: Magnesium: 2 mg/dL (ref 1.7–2.4)

## 2020-07-13 LAB — LDL CHOLESTEROL, DIRECT: Direct LDL: 126.2 mg/dL — ABNORMAL HIGH (ref 0–99)

## 2020-07-13 MED ORDER — OFF THE BEAT BOOK
Freq: Once | Status: AC
  Filled 2020-07-13: qty 1

## 2020-07-13 MED ORDER — APIXABAN 5 MG PO TABS
5.0000 mg | ORAL_TABLET | Freq: Two times a day (BID) | ORAL | 0 refills | Status: DC
  Filled 2020-07-13: qty 60, 30d supply, fill #0

## 2020-07-13 MED ORDER — METOPROLOL TARTRATE 25 MG PO TABS
12.5000 mg | ORAL_TABLET | Freq: Two times a day (BID) | ORAL | 0 refills | Status: DC
  Filled 2020-07-13: qty 30, 30d supply, fill #0

## 2020-07-13 MED ORDER — SILVER SULFADIAZINE 1 % EX CREA
TOPICAL_CREAM | Freq: Two times a day (BID) | CUTANEOUS | 0 refills | Status: DC
Start: 2020-07-13 — End: 2020-07-28
  Filled 2020-07-13: qty 85, 14d supply, fill #0

## 2020-07-13 NOTE — Discharge Summary (Signed)
Physician Discharge Summary  Patient ID: Marco White MRN: [id number] DOB/AGE: 63-May-1957 63 y.o.  Admit date: 07/09/2020 Discharge date: 07/13/2020  Primary Discharge Diagnosis: Symptomatic atrial flutter with rapid ventricular rate-on admission.. Mild to moderate aortic regurgitation. Prediabetes (A1c 5.8)  Secondary Discharge Diagnosis: History of anemia. Thrombocytopenia, chronic. Hyperlipidemia, mild  Hospital Course:   65 y.o. male  with new onset of atrial flutter presents to the hospital with a chief complaint of shortness of breath and tachycardia.  He is a practicing nephrologist at [employer] who presents to [facility] due to shortness of breath and tachycardia noted on his BP machine.  Patient stated that while at rest his heart rate was approximately 140 bpm with soft blood pressures and therefore he chose to come to the hospital for further evaluation and management.  He was found to be in atrial flutter with rapid ventricular rate with soft blood pressures.  We discussed parenteral AV nodal blocking agents in addition to anticoagulation as the atrial flutter was greater than 48 hours in duration.  This shared decision was to start Lopressor given the soft blood pressures and he was initiated on IV heparin drip.  He was monitored on the floors for couple days but he remained in atrial flutter with symptoms of shortness of breath with effort related activities.  Thereafter the shared decision was to proceed with TEE cardioversion on 07/12/2020.  No left atrium or left atrial thrombus noted.  He underwent successful cardioversion to normal sinus rhythm.  He remains in sinus rhythm overnight and asymptomatic.  He wishes to go home.   Discharge Exam: Blood pressure 102/60, pulse 66, temperature 99 F (37.2 C), temperature source Oral, resp. rate 18, height 5\' 11"  (1.803 m), weight 69 kg, SpO2 96 %.  PHYSICAL EXAM: Vitals with BMI 07/13/2020  07/13/2020 07/12/2020  Height - - -  Weight - 152 lbs 2 oz -  BMI - 21.22 -  Systolic 109 102 96  Diastolic 70 60 67  Pulse 63 66 51    CONSTITUTIONAL: Well-developed and well-nourished. No acute distress.  SKIN: Skin is warm and dry. No rash noted. No cyanosis. No pallor. No jaundice HEAD: Normocephalic and atraumatic.  EYES: No scleral icterus MOUTH/THROAT: Moist oral membranes.  NECK: No JVD present. No thyromegaly noted. No carotid bruits  LYMPHATIC: No visible cervical adenopathy.  CHEST Normal respiratory effort. No intercostal retractions.  No significant erythema over the anterior or posterior chest wall.  LUNGS: Clear to auscultation bilaterally.  No stridor. No wheezes. No rales.  CARDIOVASCULAR: Regular, positive S1-S2, soft diastolic murmur heard over the left upper sternal border, no gallops or rubs. ABDOMINAL: Nonobese, soft, nontender, nondistended, positive bowel sounds in all 4 quadrants, no apparent ascites.  EXTREMITIES: No peripheral edema  HEMATOLOGIC: No significant bruising NEUROLOGIC: Oriented to person, place, and time. Nonfocal. Normal muscle tone.  PSYCHIATRIC: Normal mood and affect. Normal behavior. Cooperative   Recommendations on discharge:   Low atrial flutter:  Presented to the hospital with symptomatic atrial flutter with rapid ventricular rate.  Underwent TEE guided direct-current cardioversion to restore normal sinus rhythm.  Rate control: Lopressor 12.5 mg p.o. twice daily with holding parameters, due to soft blood pressures.  Rhythm control: N/A  Thromboembolic prophylaxis: Eliquis 5 mg p.o. twice daily for 4 weeks given the recent TEE cardioversion.  CHA2DS2-VASc score is 0 therefore not recommending long-term oral anticoagulation for thromboembolic prophylaxis.  Risks, benefits, and alternatives to oral anticoagulation discussed.  Once his  ventricular rate slowed down the underlying rhythm appears to be typical atrial flutter which can be  ablated.  We discussed considering establishing care with cardiac electrophysiology to possibly consider that either electively or if his atrial flutter resurfaces.  Given his underlying arrhythmia he would benefit from sleep study to rule out sleep apnea.  I will see him back in 4 weeks in the office to reevaluate his underlying rhythm.  He is more than welcome to follow-up sooner if any questions or concerns arise.  If the cost is prohibitive for Eliquis he is more than welcome to pick up samples from office.  Given the new detected atrial flutter recommend an ischemic evaluation once discharged.  Have asked him to remain active but not overexerting for the next 4 weeks due to atrial stunning and premature PACs that are present on EKG.  Prediabetes: Newly diagnosed during this hospitalization. Hemoglobin A1c was checked to further risk stratify his CHA2DS2-VASc score. Focus on lifestyle changes until he follows up with PCP once discharged.  Chronic thrombocytopenia: I have asked him to follow-up with his PCP once discharged to possibly consider hematology evaluation as he has other blood indices in the past that are below normal limits.  Hyperlipidemia: Total cholesterol is less than 200, but LDL is 126.  He has had elevated cholesterol in the past as well.  He is not on statin therapy prior to admission.  His estimated 10-year risk of ASCVD is approximately 6.2%.  Would recommend lifestyle modifications for now and as outpatient consider calcium score for further risk stratification.  Aortic regurgitation: Continue to monitor.  Plan of care discussed with the patient extensively current [facility].  And has spoke personally to his wife Dr. Vickey Hugerohmeier yesterday over the phone.  EKG: 07/09/2020: Atrial flutter, 99 bpm, incomplete right bundle branch block, left posterior fascicular block, without underlying injury pattern.  07/13/2020: Normal sinus  rhythm 62 bpm, without underlying injury pattern.  Echocardiogram: TTE 07/10/2020: Left ventricular ejection fraction, by estimation, is 50 to 55%. The  left ventricle has low normal function. The left ventricle has no regional  wall motion abnormalities. Diastolic dysfunction is not assessed due to  atrial flutter.  2. Right ventricular systolic function is mildly reduced. The right  ventricular size is mildly enlarged. There is normal pulmonary artery  systolic pressure.  3. Right atrial size was mildly dilated.  4. The mitral valve is normal in structure. No evidence of mitral valve  regurgitation. No evidence of mitral stenosis.  5. The aortic valve is tricuspid. Aortic valve regurgitation is mild to  moderate. No aortic stenosis is present.  6. The inferior vena cava is dilated in size with >50% respiratory  variability, suggesting right atrial pressure of 8 mmHg.   TEE/direct-current cardioversion 07/12/2020: 1. Left ventricular ejection fraction, by estimation, is 50 to 55%. The  left ventricle has low normal function. The left ventricle has no regional  wall motion abnormalities. Diastolic function not evaluated due to rapid  Atrial Flutter.  2. Right ventricular systolic function is normal. The right ventricular  size is normal.  3. 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 The LAA  emptying velocity was 43 cm/s.  4. Spontaneous echo contrast present in right atrium.  5. The mitral valve is normal in structure. Trivial mitral valve  regurgitation. No evidence of mitral stenosis.  6. The aortic valve is tricuspid. Aortic valve regurgitation is mild. No  aortic stenosis is present.  7.  Successful cardioversion with 100 J followed by 150 J of energy delivered to restore normal sinus rhythm.  Stress Testing:  None  Heart Catheterization: None  Labs:   Lab Results  Component Value Date   WBC 7.2 07/13/2020   HGB 12.6 (L) 07/13/2020    HCT 38.3 (L) 07/13/2020   MCV 88.9 07/13/2020   PLT 117 (L) 07/13/2020    Recent Labs  Lab 07/13/20 0155  NA 138  K 3.7  CL 105  CO2 26  BUN 17  CREATININE 0.98  CALCIUM 8.8*  GLUCOSE 93    Lipid Panel     Component Value Date/Time   CHOL 198 07/13/2020 0155   TRIG 102 07/13/2020 0155   HDL 50 07/13/2020 0155   CHOLHDL 4.0 07/13/2020 0155   VLDL 20 07/13/2020 0155   LDLCALC 128 (H) 07/13/2020 0155    BNP (last 3 results) Recent Labs    07/09/20 1934 07/13/20 0155  BNP 83.5 42.0    HEMOGLOBIN A1C Lab Results  Component Value Date   HGBA1C 5.8 (H) 07/10/2020   MPG 119.76 07/10/2020    Cardiac Panel (last 3 results) No results for input(s): CKTOTAL, CKMB, TROPONINI, RELINDX in the last 8760 hours.  No results found for: CKTOTAL, CKMB, CKMBINDEX, TROPONINI   TSH Recent Labs    07/09/20 2026  TSH 3.541    Radiology: DG Chest Portable 1 View  Result Date: 07/09/2020 CLINICAL DATA:  Exertional shortness of breath. Dizziness for the past hour. Tachycardia. EXAM: PORTABLE CHEST 1 VIEW COMPARISON:  None. FINDINGS: Mildly enlarged cardiac silhouette. Clear lungs with normal vascularity. Mild lower thoracic spine degenerative change. IMPRESSION: Mild cardiomegaly. No acute abnormality. Electronically Signed   By: Beckie Salts M.D.   On: 07/09/2020 20:07   ECHOCARDIOGRAM COMPLETE  Result Date: 07/10/2020    ECHOCARDIOGRAM REPORT   Patient Name:   KASSON LAMERE Date of Exam: 07/10/2020 Medical Rec #:  [id number]     Height:       71.0 in Accession #:    [id number]    Weight:       153.9 lb Date of Birth:  December 02, 1955     BSA:          1.886 m Patient Age:    64 years      BP:           91/58 mmHg Patient Gender: M             HR:           70 bpm. Exam Location:  Inpatient Procedure: 2D Echo, Cardiac Doppler and Color Doppler Indications:     I48.92* Unspecified atrial flutter  History:         Patient has no prior history of Echocardiogram examinations.                   Abnormal ECG, Arrythmias:Atrial Flutter;                  Signs/Symptoms:Shortness of Breath and Dyspnea.  Sonographer:     Sheralyn Boatman RDCS Referring Phys:  [id number] Tessa Lerner Diagnosing Phys: Tessa Lerner DO  Sonographer Comments: Technically difficult study due to poor echo windows and suboptimal apical window. Image acquisition challenging due to respiratory motion. Extremely difficult apical views. IMPRESSIONS  1. Left ventricular ejection fraction, by estimation, is 50 to 55%. The left ventricle has low normal function. The left ventricle has no regional wall motion abnormalities. Diastolic dysfunction is  not assessed due to atrial flutter.  2. Right ventricular systolic function is mildly reduced. The right ventricular size is mildly enlarged. There is normal pulmonary artery systolic pressure.  3. Right atrial size was mildly dilated.  4. The mitral valve is normal in structure. No evidence of mitral valve regurgitation. No evidence of mitral stenosis.  5. The aortic valve is tricuspid. Aortic valve regurgitation is mild to moderate. No aortic stenosis is present.  6. The inferior vena cava is dilated in size with >50% respiratory variability, suggesting right atrial pressure of 8 mmHg. FINDINGS  Left Ventricle: Left ventricular ejection fraction, by estimation, is 50 to 55%. The left ventricle has low normal function. The left ventricle has no regional wall motion abnormalities. The left ventricular internal cavity size was normal in size. There is no left ventricular hypertrophy. Diastolic dysfunction is not assessed due to atrial flutter. Right Ventricle: The right ventricular size is mildly enlarged. No increase in right ventricular wall thickness. Right ventricular systolic function is mildly reduced. There is normal pulmonary artery systolic pressure. The tricuspid regurgitant velocity  is 1.68 m/s, and with an assumed right atrial pressure of 8 mmHg, the estimated right ventricular systolic pressure is  19.3 mmHg. Left Atrium: Left atrial size was normal in size. Right Atrium: Right atrial size was mildly dilated. Pericardium: There is no evidence of pericardial effusion. Mitral Valve: The mitral valve is normal in structure. No evidence of mitral valve regurgitation. No evidence of mitral valve stenosis. Tricuspid Valve: The tricuspid valve is normal in structure. Tricuspid valve regurgitation is trivial. No evidence of tricuspid stenosis. Aortic Valve: The aortic valve is tricuspid. Aortic valve regurgitation is mild to moderate. Aortic regurgitation PHT measures 606 msec. No aortic stenosis is present. Pulmonic Valve: The pulmonic valve was normal in structure. Pulmonic valve regurgitation is trivial. Aorta: The aortic root and ascending aorta are structurally normal, with no evidence of dilitation. Venous: The inferior vena cava is dilated in size with greater than 50% respiratory variability, suggesting right atrial pressure of 8 mmHg. IAS/Shunts: The atrial septum is grossly normal.  LEFT VENTRICLE PLAX 2D LVIDd:         4.60 cm LVIDs:         3.20 cm LV PW:         1.00 cm LV IVS:        0.90 cm LVOT diam:     2.70 cm LV SV:         74 LV SV Index:   39 LVOT Area:     5.73 cm  LV Volumes (MOD) LV vol d, MOD A4C: 78.5 ml LV vol s, MOD A4C: 26.8 ml LV SV MOD A4C:     78.5 ml RIGHT VENTRICLE            IVC RV S prime:     9.46 cm/s  IVC diam: 2.30 cm TAPSE (M-mode): 3.1 cm LEFT ATRIUM           Index       RIGHT ATRIUM           Index LA diam:      2.50 cm 1.33 cm/m  RA Area:     22.10 cm LA Vol (A4C): 34.1 ml 18.08 ml/m RA Volume:   64.50 ml  34.19 ml/m  AORTIC VALVE LVOT Vmax:   61.50 cm/s LVOT Vmean:  39.900 cm/s LVOT VTI:    0.129 m AI PHT:      606 msec  AORTA Ao Root diam: 3.50 cm MITRAL VALVE                TRICUSPID VALVE MV Area (PHT): 3.85 cm     TR Peak grad:   11.3 mmHg MV Decel Time: 197 msec     TR Vmax:        168.00 cm/s MV E velocity: 105.50 cm/s                             SHUNTS                              Systemic VTI:  0.13 m                             Systemic Diam: 2.70 cm Quinlin Conant DO Electronically signed by Tessa Lerner DO Signature Date/Time: 07/10/2020/1:55:48 PM    Final    ECHO TEE  Result Date: 07/12/2020    TRANSESOPHOGEAL ECHO REPORT   Patient Name:   JAKARI SADA Date of Exam: 07/12/2020 Medical Rec #:  [id number]     Height:       71.0 in Accession #:    [id number]    Weight:       152.6 lb Date of Birth:  05-Jun-1955     BSA:          1.880 m Patient Age:    64 years      BP:           114/72 mmHg Patient Gender: M             HR:           131 bpm. Exam Location:  Inpatient Procedure: Transesophageal Echo, Color Doppler and Cardiac Doppler Indications:     I48.91* Unspeicified atrial fibrillation  History:         Patient has no prior history of Echocardiogram examinations and                  Patient has prior history of Echocardiogram examinations, most                  recent 07/10/2020.  Sonographer:     Roosvelt Maser RDCS Referring Phys:  [id number] Tessa Lerner Diagnosing Phys: Tessa Lerner DO PROCEDURE: The transesophogeal probe was passed without difficulty through the esophogus of the patient. Imaged were obtained with the patient in a left lateral decubitus position. Local oropharyngeal anesthetic was provided with Cetacaine. Sedation performed by different physician. The patient was monitored while under deep sedation. Anesthestetic sedation was provided intravenously by Anesthesiology:  of Propofol. Image quality was good. The patient's vital signs; including heart rate, blood pressure, and oxygen saturation; remained stable throughout the procedure. Supplementary images were obtained from transthoracic windows as indicated to answer the clinical question. The patient developed no complications during the procedure. A successful direct current cardioversion was performed at 150J joules with 2 attempts. IMPRESSIONS  1. Left ventricular ejection fraction, by estimation,  is 50 to 55%. The left ventricle has low normal function. The left ventricle has no regional wall motion abnormalities. Diastolic function not evaluated due to rapid Atrial Flutter.  2. Right ventricular systolic function is normal. The right ventricular size is normal.  3. 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 The LAA emptying velocity was 43  cm/s.  4. Spontaneous echo contrast present in right atrium.  5. The mitral valve is normal in structure. Trivial mitral valve regurgitation. No evidence of mitral stenosis.  6. The aortic valve is tricuspid. Aortic valve regurgitation is mild. No aortic stenosis is present. Conclusion(s)/Recommendation(s): No LA/LAA thrombus identified. Successful cardioversion performed with restoration of normal sinus rhythm. FINDINGS  Left Ventricle: Left ventricular ejection fraction, by estimation, is 50 to 55%. The left ventricle has low normal function. The left ventricle has no regional wall motion abnormalities. The left ventricular internal cavity size was normal in size. There is no left ventricular hypertrophy. Diastolic function not evaluated due to rapid Atrial Flutter. Right Ventricle: The right ventricular size is normal. No increase in right ventricular wall thickness. Right ventricular systolic function is normal. Left Atrium: Left atrial size was normal in size. 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 The LAA emptying velocity was 43 cm/s. Right Atrium: Right atrial size was normal in size. Prominent Chiari network and Spontaneous echo contrast present in right atrium. Pericardium: There is no evidence of pericardial effusion. Mitral Valve: The mitral valve is normal in structure. Trivial mitral valve regurgitation. No evidence of mitral valve stenosis. Tricuspid Valve: The tricuspid valve is normal in structure. Tricuspid valve regurgitation is trivial. No evidence of tricuspid stenosis. Aortic Valve: The aortic valve is tricuspid.  Aortic valve regurgitation is mild. No aortic stenosis is present. Pulmonic Valve: The pulmonic valve was normal in structure. Pulmonic valve regurgitation is trivial. No evidence of pulmonic stenosis. Aorta: The aortic root and ascending aorta are structurally normal, with no evidence of dilitation. There is minimal (Grade I) plaque involving the descending aorta and transverse aorta. IAS/Shunts: The interatrial septum was not assessed.   AORTA Ao Root diam: 3.70 cm Ao Asc diam:  2.70 cm Glendene Wyer DO Electronically signed by Tessa Lerner DO Signature Date/Time: 07/12/2020/6:11:22 PM    Final    FOLLOW UP PLANS AND APPOINTMENTS  Allergies as of 07/13/2020   No Known Allergies     Medication List    TAKE these medications   apixaban 5 MG Tabs tablet Commonly known as: ELIQUIS Take 1 tablet (5 mg total) by mouth 2 (two) times daily.   cholecalciferol 25 MCG (1000 UNIT) tablet Commonly known as: VITAMIN D3 Take 1,000 Units by mouth daily.   finasteride 5 MG tablet Commonly known as: PROSCAR Take 1 mg by mouth daily.   metoprolol tartrate 25 MG tablet Commonly known as: LOPRESSOR Take 0.5 tablets (12.5 mg total) by mouth 2 (two) times daily. Hold if systolic blood pressure (top blood pressure number) less than 100 mmHg or pulse 60bpm.   NONFORMULARY OR COMPOUNDED ITEM Apply 1 application topically daily. Fluconazole 2%Terb 1% DMSO - Apply to toe nail   silver sulfADIAZINE 1 % cream Commonly known as: SILVADENE Apply topically 2 (two) times daily.       Follow-up Information    Martyna Thorns, DO Follow up in 2 week(s).   Specialties: Cardiology, Radiology, Vascular Surgery Contact information: [address] [place] [address] [phone]        Darnelle Going, MD Follow up in 1 week(s).   Specialty: Internal Medicine Contact information: [address] [place] [address] [phone]              Total time spent: 37 minutes  Delilah Shan [facility]  Pager: [phone] Office: [phone]

## 2020-07-13 NOTE — Progress Notes (Signed)
Pt is alert and oriented. Discharge instructions/ AVS given to pt. 

## 2020-07-13 NOTE — Discharge Summary (Signed)
Please see document type "discharge planning."  Delilah Shan [facility]  Pager: [phone] Office: [phone]

## 2020-07-13 NOTE — Discharge Instructions (Signed)
Atrial Flutter  Atrial flutter is a type of abnormal heart rhythm (arrhythmia). The heart has an electrical system that tells it how to beat. In atrial flutter, the signals move rapidly in the top chambers of the heart (the atria). This makes your heart beat very fast. Atrial flutter can come and go, or it can be permanent. The goal of treatment is to prevent blood clots from forming, control your heart rate, or restore your heartbeat to a normal rhythm. If this condition is not treated, it can cause serious problems, such as a weakened heart muscle (cardiomyopathy) or a stroke. What are the causes? This condition is often caused by conditions that damage the heart's electrical system. These include:  Heart conditions and heart surgery. These include heart attacks and open-heart surgery.  Lung problems, such as COPD or a blood clot in the lung (pulmonary embolism, or PE).  Poorly controlled high blood pressure (hypertension).  Overactive thyroid (hyperthyroidism).  Diabetes. In some cases, the cause of this condition is not known. What increases the risk? You are more likely to develop this condition if:  You are an elderly adult.  You are a man.  You are overweight (obese).  You have obstructive sleep apnea.  You have a family history of atrial flutter.  You have diabetes.  You drink a lot of alcohol, especially binge drinking.  You use drugs, including cannabis.  You smoke. What are the signs or symptoms? Symptoms of this condition include:  A feeling that your heart is pounding or racing (palpitations).  Shortness of breath.  Chest pain.  Feeling dizzy or light-headed.  Fainting.  Low blood pressure (hypotension).  Fatigue.  Tiring easily during exercise or activity. In some cases, there are no symptoms. How is this diagnosed? This condition may be diagnosed with:  An electrocardiogram (ECG) to check electrical signals of the heart.  An ambulatory  cardiac monitor to record your heart's activity for a few days.  An echocardiogram to create pictures of your heart.  A transesophageal echocardiogram (TEE) to create even better pictures of your heart.  A stress test to check your blood supply while you exercise.  Imaging tests, such as a CT scan or chest X-ray.  Blood tests. How is this treated? Treatment depends on underlying conditions and how you feel when you experience atrial flutter. This condition may be treated with:  Medicines to prevent blood clots or to treat heart rate or heart rhythm problems.  Electrical cardioversion to reset the heart's rhythm.  Ablation to remove the heart tissue that sends abnormal signals.  Left atrial appendage closure to seal the area where blood clots can form. In some cases, underlying conditions will be treated. Follow these instructions at home: Medicines  Take over-the-counter and prescription medicines only as told by your health care provider.  Do not take any new medicines without talking to your health care provider.  If you are taking blood thinners: ? Talk with your health care provider before you take any medicines that contain aspirin or NSAIDs, such as ibuprofen. These medicines increase your risk for dangerous bleeding. ? Take your medicine exactly as told, at the same time every day. ? Avoid activities that could cause injury or bruising, and follow instructions about how to prevent falls. ? Wear a medical alert bracelet or carry a card that lists what medicines you take. Lifestyle  Eat heart-healthy foods. Talk with a dietitian to make an eating plan that is right for you.  Do   not use any products that contain nicotine or tobacco, such as cigarettes, e-cigarettes, and chewing tobacco. If you need help quitting, ask your health care provider.  Do not drink alcohol.  Do not use drugs, including cannabis.  Lose weight if you are overweight or obese.  Exercise  regularly as instructed by your health care provider. General instructions  Do not use diet pills unless your health care provider approves. Diet pills may make heart problems worse.  If you have obstructive sleep apnea, manage your condition as told by your health care provider.  Keep all follow-up visits as told by your health care provider. This is important. Contact a health care provider if you:  Notice a change in the rate, rhythm, or strength of your heartbeat.  Are taking a blood thinner and you notice more bruising.  Have a sudden change in weight.  Tire more easily when you exercise or do heavy work. Get help right away if you have:  Pain or pressure in your chest.  Shortness of breath.  Fainting.  Increasing sweating with no known cause.  Side effects of blood thinners, such as blood in your vomit, stool, or urine, or bleeding that cannot stop.  Any symptoms of a stroke. "BE FAST" is an easy way to remember the main warning signs of a stroke: ? B - Balance. Signs are dizziness, sudden trouble walking, or loss of balance. ? E - Eyes. Signs are trouble seeing or a sudden change in vision. ? F - Face. Signs are sudden weakness or numbness of the face, or the face or eyelid drooping on one side. ? A - Arms. Signs are weakness or numbness in an arm. This happens suddenly and usually on one side of the body. ? S - Speech. Signs are sudden trouble speaking, slurred speech, or trouble understanding what people say. ? T - Time. Time to call emergency services. Write down what time symptoms started.  Other signs of a stroke, such as: ? A sudden, severe headache with no known cause. ? Nausea or vomiting. ? Seizure.  These symptoms may represent a serious problem that is an emergency. Do not wait to see if the symptoms will go away. Get medical help right away. Call your local emergency services (911 in the [place] Do not drive yourself to the hospital. Summary  Atrial  flutter is an abnormal heart rhythm that can give you symptoms of palpitations, shortness of breath, or fatigue.  Atrial flutter is often treated with medicines to keep your heart in a normal rhythm and to prevent a stroke.  Get help right away if you cannot catch your breath, or have chest pain or pressure.  Get help right away if you have signs or symptoms of a stroke. This information is not intended to replace advice given to you by your health care provider. Make sure you discuss any questions you have with your health care provider. Document Revised: 09/10/2018 Document Reviewed: 09/10/2018 Elsevier Patient Education  2021 [employer] Inc.  

## 2020-07-13 NOTE — TOC Benefit Eligibility Note (Signed)
Patient Product/process development scientist completed.    The patient is currently admitted and upon discharge could be taking Eliquis 5 mg.  The current 30 day co-pay is, $80.00.   The patient is insured through [employer]    Roland Earl, CPhT Pharmacy Patient Advocate Specialist [facility] Antimicrobial Stewardship Team Direct Number: [phone]  Fax: [phone]

## 2020-07-13 NOTE — Plan of Care (Signed)

## 2020-07-22 ENCOUNTER — Other Ambulatory Visit: Payer: Self-pay | Admitting: Cardiology

## 2020-07-22 DIAGNOSIS — I483 Typical atrial flutter: Secondary | ICD-10-CM

## 2020-07-26 ENCOUNTER — Other Ambulatory Visit: Payer: Self-pay

## 2020-07-26 ENCOUNTER — Inpatient Hospital Stay (HOSPITAL_COMMUNITY)
Admission: EM | Admit: 2020-07-26 | Discharge: 2020-07-28 | DRG: 274 | Disposition: A | Discharge status: Home / Self Care | Payer: PRIVATE HEALTH INSURANCE | Attending: Cardiology | Admitting: Cardiology

## 2020-07-26 ENCOUNTER — Encounter (HOSPITAL_COMMUNITY): Payer: Self-pay | Admitting: Emergency Medicine

## 2020-07-26 DIAGNOSIS — R7303 Prediabetes: Secondary | ICD-10-CM | POA: Diagnosis present

## 2020-07-26 DIAGNOSIS — I4892 Unspecified atrial flutter: Secondary | ICD-10-CM | POA: Diagnosis present

## 2020-07-26 DIAGNOSIS — I4891 Unspecified atrial fibrillation: Secondary | ICD-10-CM | POA: Diagnosis present

## 2020-07-26 DIAGNOSIS — Z79899 Other long term (current) drug therapy: Secondary | ICD-10-CM

## 2020-07-26 DIAGNOSIS — Z20822 Contact with and (suspected) exposure to covid-19: Secondary | ICD-10-CM | POA: Diagnosis present

## 2020-07-26 DIAGNOSIS — D649 Anemia, unspecified: Secondary | ICD-10-CM | POA: Diagnosis present

## 2020-07-26 DIAGNOSIS — I484 Atypical atrial flutter: Principal | ICD-10-CM | POA: Diagnosis present

## 2020-07-26 DIAGNOSIS — E785 Hyperlipidemia, unspecified: Secondary | ICD-10-CM | POA: Diagnosis present

## 2020-07-26 DIAGNOSIS — I471 Supraventricular tachycardia: Secondary | ICD-10-CM | POA: Diagnosis present

## 2020-07-26 DIAGNOSIS — I351 Nonrheumatic aortic (valve) insufficiency: Secondary | ICD-10-CM | POA: Diagnosis present

## 2020-07-26 DIAGNOSIS — Z7901 Long term (current) use of anticoagulants: Secondary | ICD-10-CM

## 2020-07-26 DIAGNOSIS — D696 Thrombocytopenia, unspecified: Secondary | ICD-10-CM | POA: Diagnosis present

## 2020-07-26 DIAGNOSIS — I483 Typical atrial flutter: Secondary | ICD-10-CM | POA: Diagnosis not present

## 2020-07-26 LAB — SARS CORONAVIRUS 2 (TAT 6-24 HRS): SARS Coronavirus 2: NEGATIVE

## 2020-07-26 LAB — BASIC METABOLIC PANEL
Anion gap: 8 (ref 5–15)
BUN: 22 mg/dL (ref 8–23)
CO2: 24 mmol/L (ref 22–32)
Calcium: 9.1 mg/dL (ref 8.9–10.3)
Chloride: 104 mmol/L (ref 98–111)
Creatinine, Ser: 0.89 mg/dL (ref 0.61–1.24)
GFR, Estimated: >60 mL/min (ref >60)
Glucose, Bld: 93 mg/dL (ref 70–99)
Potassium: 3.9 mmol/L (ref 3.5–5.1)
Sodium: 136 mmol/L (ref 135–145)

## 2020-07-26 LAB — CBC
HCT: 45 % (ref 39.0–52.0)
Hemoglobin: 15 g/dL (ref 13.0–17.0)
MCH: 29.4 pg (ref 26.0–34.0)
MCHC: 33.3 g/dL (ref 30.0–36.0)
MCV: 88.1 fL (ref 80.0–100.0)
Platelets: 137 10*3/uL — ABNORMAL LOW (ref 150–400)
RBC: 5.11 MIL/uL (ref 4.22–5.81)
RDW: 12.7 % (ref 11.5–15.5)
WBC: 4.3 10*3/uL (ref 4.0–10.5)
nRBC: 0 % (ref 0.0–0.2)

## 2020-07-26 MED ORDER — FINASTERIDE 5 MG PO TABS
2.5000 mg | ORAL_TABLET | Freq: Every day | ORAL | Status: DC
  Administered 2020-07-27 – 2020-07-28 (×2): 2.5 mg via ORAL
  Filled 2020-07-26 (×3): qty 0.5

## 2020-07-26 MED ORDER — ACETAMINOPHEN 325 MG PO TABS: 650.0000 mg | ORAL_TABLET | ORAL | Status: DC | PRN

## 2020-07-26 MED ORDER — METOPROLOL TARTRATE 12.5 MG HALF TABLET
12.5000 mg | ORAL_TABLET | Freq: Two times a day (BID) | ORAL | Status: DC
  Filled 2020-07-26 (×2): qty 1

## 2020-07-26 MED ORDER — PROPOFOL 10 MG/ML IV BOLUS
0.5000 mg/kg | Freq: Once | INTRAVENOUS | Status: DC
  Filled 2020-07-26: qty 20

## 2020-07-26 MED ORDER — ONDANSETRON HCL 4 MG/2ML IJ SOLN: 4.0000 mg | Freq: Four times a day (QID) | INTRAMUSCULAR | Status: DC | PRN

## 2020-07-26 MED ORDER — APIXABAN 5 MG PO TABS
5.0000 mg | ORAL_TABLET | Freq: Two times a day (BID) | ORAL | Status: DC
  Administered 2020-07-26 – 2020-07-28 (×4): 5 mg via ORAL
  Filled 2020-07-26 (×4): qty 1

## 2020-07-26 NOTE — Consult Note (Addendum)
Cardiology Consultation:   Patient ID: Marco White MRN: [id number]; DOB: 24-Mar-1956  Admit date: 07/26/2020 Date of Consult: 07/26/2020  PCP:  [facility]  Cardiologist:  Dr. Odis Hollingshead :[id number]}    Patient Profile:   Marco White is a 65 y.o. male with a hx of anemia and new onset AFlutter a couple weeks ago who is being seen today for the evaluation of AFlutter at the request of Dr. Jacinto Halim.  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 Odis Hollingshead admitted him with new onset AFlutter w/RVR, started on a/c  LVEF 50-55%, no WMA, RV OK, rate controlled with BB Had TEE/DCCV 07/12/20 LV: low -normal EF. RV: Normal structure and function. LA: Grossly normal.  Spontaneous echo contrast was not present.  No thrombus present. Left atrial appendage: Spontaneous echo contrast was not present.  No thrombus present. RA: Grossly normal. Spontaneous echo contrast was present.   MV: trivial regurgitation,no stenosis, no vegetation noted.  TV: trivial regurgitation, nostenosis, no vegetation noted. AV: Mild Regurgitation, no stenosis, no vegetation noted.   PV: Trivial regurgitation, no stenosis, no vegetation noted. Thoracic and ascending aorta: Mild plaque. Cardioverted 2 time(s).  Cardioversion with synchronized biphasic 100J followed by 150J shock. Evaluation: Findings: Post procedure EKG shows: NSR Complications: None Patient did tolerate procedure well. Discharged 07/13/20, note mentions chronic thrombocytopenia and pre-DM with Hgb A1c of 5.8 Started on  Eliquis 5mg  BID Lopressor 12.5mg  BID  He present to the ER today with recurrent palpitations, found in rate controlled AFlutter, planned for admission and EP is asked on board for consideration of ablation for management strategy.  LABS K+ 3.9 BUN/Creat 22/0.89 WBC  4.3 H/H 15/45 Plts 137  07/09/20 COVID neg, today's pending  He feels well generally without any kind of cardiac awareness or exertional intolerances.  In Aflutter he is aware of the irregularity of his heart beat and makes him feel fatigued, tired, and a little winded.  Since his discharge he has had a couple of moments of palpitations though yesterday worked all day and felt well, this morning he woke and could tell he was back in flutter, presumed overnight sometime started.  He took his Eliquis this morning at home  Past Medical History:  Diagnosis Date  . Anemia     Past Surgical History:  Procedure Laterality Date  . CARDIOVERSION N/A 07/12/2020   Procedure: CARDIOVERSION;  Surgeon: 09/11/2020, DO;  Location: [facility];  Service: Cardiovascular;  Laterality: N/A;  . HERNIA REPAIR    . LUMBAR MICRODISCECTOMY    . MENISCUS REPAIR    . TEE WITHOUT CARDIOVERSION N/A 07/12/2020   Procedure: TRANSESOPHAGEAL ECHOCARDIOGRAM (TEE);  Surgeon: 09/11/2020, DO;  Location: [facility];  Service: Cardiovascular;  Laterality: N/A;     Home Medications:  Prior to Admission medications   Medication Sig Start Date End Date Taking? Authorizing Provider  apixaban (ELIQUIS) 5 MG TABS tablet Take 1 tablet (5 mg total) by mouth 2 (two) times daily. 07/13/20 08/12/20  Tolia, Sunit, DO  cholecalciferol (VITAMIN D3) 25 MCG (1000 UNIT) tablet Take 1,000 Units by mouth daily.    [provider]  finasteride (PROSCAR) 5 MG tablet Take 1 mg by mouth daily.    [provider]  metoprolol tartrate (LOPRESSOR) 25 MG tablet Take 0.5 tablets (12.5 mg total) by mouth 2 (two) times  daily. Hold if systolic blood pressure (top blood pressure number) less than 100 mmHg or pulse 60bpm. 07/13/20 08/12/20  Tolia, Sunit, DO  NONFORMULARY OR COMPOUNDED ITEM Apply 1 application topically daily. Fluconazole 2%Terb 1% DMSO - Apply to toe nail    [provider]  silver sulfADIAZINE (SILVADENE) 1  % cream Apply topically 2 (two) times daily. 07/13/20   Tessa Lerner, DO    Inpatient Medications: Scheduled Meds: . apixaban  5 mg Oral BID  . finasteride  2.5 mg Oral Daily  . metoprolol tartrate  12.5 mg Oral BID   Continuous Infusions:  PRN Meds: acetaminophen, ondansetron (ZOFRAN) IV  Allergies:   No Known Allergies  Social History:   Social History   Socioeconomic History  . Marital status: Unknown    Spouse name: Not on file  . Number of children: Not on file  . Years of education: Not on file  . Highest education level: Not on file  Occupational History  . Not on file  Tobacco Use  . Smoking status: Never Smoker  . Smokeless tobacco: Never Used  Vaping Use  . Vaping Use: Never used  Substance and Sexual Activity  . Alcohol use: Yes    Comment: Occasional  . Drug use: Never  . Sexual activity: Not on file  Other Topics Concern  . Not on file  Social History Narrative  . Not on file   Social Determinants of Health   Financial Resource Strain: Not on file  Food Insecurity: Not on file  Transportation Needs: Not on file  Physical Activity: Not on file  Stress: Not on file  Social Connections: Not on file  Intimate Partner Violence: Not on file    Family History:   Family History  Problem Relation Age of Onset  . Atrial fibrillation Mother   . Polycystic kidney disease Mother   . Atrial fibrillation Father   . Prostate cancer Father      ROS:  Please see the history of present illness.  All other ROS reviewed and negative.     Physical Exam/Data:   Vitals:   07/26/20 1015 07/26/20 1030 07/26/20 1107 07/26/20 1123  BP: 107/73 111/77 121/84   Pulse: 71 67 77 68  Resp: 10 (!) 22 16   Temp:   98.9 F (37.2 C) 97.6 F (36.4 C)  TempSrc:   Oral Oral  SpO2: 98% 98% 99% 99%  Weight:      Height:       No intake or output data in the 24 hours ending 07/26/20 1125 Last 3 Weights 07/26/2020 07/13/2020 07/12/2020  Weight (lbs) 150 lb 152 lb 1.6 oz  152 lb 9.6 oz  Weight (kg) 68.04 kg 68.992 kg 69.219 kg     Body mass index is 20.92 kg/m.  General:  Well nourished, well developed, in no acute distress HEENT: normal Lymph: no adenopathy Neck: no JVD Endocrine:  No thryomegaly Vascular: No carotid bruits Cardiac: irreg-irreg; no murmurs, gallops or rubs Lungs:  CTA b/l, no wheezing, rhonchi or rales  Abd: soft, nontender Ext: no edema Musculoskeletal:  No deformities Skin: warm and dry  Neuro:  no focal abnormalities noted Psych:  Normal affect   EKG:  The EKG was personally reviewed and demonstrates:   AFlutter (typical): 81bpm  OLD 07/13/20 SR 62bom, normal intervals  Telemetry:  Telemetry was personally reviewed and demonstrates:   AFlutter 70's   Relevant CV Studies:  07/10/2020: 1. Left ventricular ejection fraction, by estimation, is 50  to 55%. The  left ventricle has low normal function. The left ventricle has no regional  wall motion abnormalities. Diastolic dysfunction is not assessed due to  atrial flutter.  2. Right ventricular systolic function is mildly reduced. The right  ventricular size is mildly enlarged. There is normal pulmonary artery  systolic pressure.  3. Right atrial size was mildly dilated.  4. The mitral valve is normal in structure. No evidence of mitral valve  regurgitation. No evidence of mitral stenosis.  5. The aortic valve is tricuspid. Aortic valve regurgitation is mild to  moderate. No aortic stenosis is present.  6. The inferior vena cava is dilated in size with >50% respiratory  variability, suggesting right atrial pressure of 8 mmHg.   Laboratory Data:  High Sensitivity Troponin:   Recent Labs  Lab 07/09/20 1934 07/09/20 2151 07/10/20 0426 07/10/20 0705  TROPONINIHS 6 6 6 6      Chemistry Recent Labs  Lab 07/26/20 0843  NA 136  K 3.9  CL 104  CO2 24  GLUCOSE 93  BUN 22  CREATININE 0.89  CALCIUM 9.1  GFRNONAA >60  ANIONGAP 8    No results for  input(s): PROT, ALBUMIN, AST, ALT, ALKPHOS, BILITOT in the last 168 hours. Hematology Recent Labs  Lab 07/26/20 0843  WBC 4.3  RBC 5.11  HGB 15.0  HCT 45.0  MCV 88.1  MCH 29.4  MCHC 33.3  RDW 12.7  PLT 137*   BNPNo results for input(s): BNP, PROBNP in the last 168 hours.  DDimer No results for input(s): DDIMER in the last 168 hours.   Radiology/Studies:  No results found.   Assessment and Plan:   1. Aflutter (typical)     CHA2DS2Vasc is zero.  There is mention of "pre-DM" Score of one?     On Eliquis, appropriately dosed, he reports no missed doses of his Eliqus at home, and took it this morning prior to coming in  Suspect started last night by symptoms  He would prefer to avoid AADs and would like consideration for ablation We discussed the procedure, potential risks/benefits and he would like to proceed as schedule allows.  I have placed him on tomorrow's schedule tenatively for Dr. 07/28/20   Risk Assessment/Risk Scores:  { For questions or updates, please contact [facility] Please consult [email or web address] for contact info under    Signed, Elberta Fortis, PA-C  07/26/2020 11:25 AM     I have seen, examined the patient, and reviewed the above assessment and plan.  Changes to above are made where necessary.  On exam, well appearing, NAD, iRRR.  He has symptomatic recurrent typical appearing atrial flutter. Therapeutic strategies for atrial flutter including medicine and ablation were discussed in detail with the patient today. Risk, benefits, and alternatives to EP study and radiofrequency ablation were also discussed in detail today. These risks include but are not limited to stroke, bleeding, vascular damage, tamponade, perforation, damage to the heart and other structures, AV block requiring pacemaker, worsening renal function, and death. The patient understands these risk and wishes to proceed.  We will tentatively schedule with Dr 07/28/2020 for tomorrow.  Co  Sign: Elberta Fortis, MD

## 2020-07-26 NOTE — ED Triage Notes (Signed)
Patient coming from home. Complaint of palpitations. Patient endorses intermittent palpitations that became sustained this morning. A&Ox4.

## 2020-07-26 NOTE — Plan of Care (Signed)

## 2020-07-26 NOTE — ED Notes (Signed)
Report given to [facility], RN of [phone]

## 2020-07-26 NOTE — ED Provider Notes (Signed)
[facility] EMERGENCY DEPARTMENT Provider Note   CSN: [id number] Arrival date & time: 07/26/20  8657     History Chief Complaint  Patient presents with  . Palpitations    Marco White is a 65 y.o. male.  Patient with recent new diagnosis Afib/flutter s/p recent TEE cardioversion and initiation of eliquis/metoprolol (2 weeks ago), presents feeling as if back in a flutter in past day. Symptoms acute onset, moderate, persistent. No associated chest pain or discomfort. +mild fatigue/mild doe. No swelling. No orthopnea/pnd. No fever or chills. No cough or uri symptoms. Compliant w home meds.   The history is provided by the patient and a significant other.  Palpitations Associated symptoms: no chest pain, no cough and no nausea        Past Medical History:  Diagnosis Date  . Anemia     Patient Active Problem List   Diagnosis Date Noted  . Prediabetes   . History of anemia   . Thrombocytopenia (HCC)   . Hyperlipidemia, mild   . Typical atrial flutter (HCC)   . Nonrheumatic aortic valve insufficiency   . Shortness of breath 07/09/2020    Past Surgical History:  Procedure Laterality Date  . CARDIOVERSION N/A 07/12/2020   Procedure: CARDIOVERSION;  Surgeon: Tessa Lerner, DO;  Location: [facility] ENDOSCOPY;  Service: Cardiovascular;  Laterality: N/A;  . HERNIA REPAIR    . LUMBAR MICRODISCECTOMY    . MENISCUS REPAIR    . TEE WITHOUT CARDIOVERSION N/A 07/12/2020   Procedure: TRANSESOPHAGEAL ECHOCARDIOGRAM (TEE);  Surgeon: Tessa Lerner, DO;  Location: [facility] ENDOSCOPY;  Service: Cardiovascular;  Laterality: N/A;       No family history on file.  Social History   Tobacco Use  . Smoking status: Never Smoker  . Smokeless tobacco: Never Used  Vaping Use  . Vaping Use: Never used  Substance Use Topics  . Alcohol use: Yes    Comment: Occasional  . Drug use: Never    Home Medications Prior to Admission medications   Medication Sig Start Date End Date Taking?  Authorizing Provider  apixaban (ELIQUIS) 5 MG TABS tablet Take 1 tablet (5 mg total) by mouth 2 (two) times daily. 07/13/20 08/12/20  Tolia, Sunit, DO  cholecalciferol (VITAMIN D3) 25 MCG (1000 UNIT) tablet Take 1,000 Units by mouth daily.    [provider]  finasteride (PROSCAR) 5 MG tablet Take 1 mg by mouth daily.    [provider]  metoprolol tartrate (LOPRESSOR) 25 MG tablet Take 0.5 tablets (12.5 mg total) by mouth 2 (two) times daily. Hold if systolic blood pressure (top blood pressure number) less than 100 mmHg or pulse 60bpm. 07/13/20 08/12/20  Tolia, Sunit, DO  NONFORMULARY OR COMPOUNDED ITEM Apply 1 application topically daily. Fluconazole 2%Terb 1% DMSO - Apply to toe nail    [provider]  silver sulfADIAZINE (SILVADENE) 1 % cream Apply topically 2 (two) times daily. 07/13/20   Tessa Lerner, DO    Allergies    Patient has no known allergies.  Review of Systems   Review of Systems  Constitutional: Negative for fever.  HENT: Negative for sore throat.   Eyes: Negative for redness.  Respiratory: Negative for cough.   Cardiovascular: Positive for palpitations. Negative for chest pain and leg swelling.  Gastrointestinal: Negative for nausea.  Genitourinary: Negative for flank pain.  Musculoskeletal: Negative for neck pain.  Skin: Negative for rash.  Neurological: Negative for syncope.  Hematological:       On anticoagulant therapy  Psychiatric/Behavioral: Negative for confusion.    Physical Exam Updated Vital Signs BP 106/61   Pulse 90   Temp 98 F (36.7 C) (Oral)   Resp 18   Ht 1.803 m ( )   Wt 68 kg   SpO2 97%   BMI 20.92 kg/m   Physical Exam Vitals and nursing note reviewed.  Constitutional:      Appearance: Normal appearance. He is well-developed.  HENT:     Head: Atraumatic.     Nose: Nose normal.     Mouth/Throat:     Mouth: Mucous membranes are moist.  Eyes:     General: No scleral icterus.    Conjunctiva/sclera:  Conjunctivae normal.  Neck:     Trachea: No tracheal deviation.  Cardiovascular:     Rate and Rhythm: Normal rate. Rhythm irregular.     Pulses: Normal pulses.     Heart sounds: Normal heart sounds. No murmur heard. No friction rub. No gallop.   Pulmonary:     Effort: Pulmonary effort is normal. No accessory muscle usage or respiratory distress.     Breath sounds: Normal breath sounds.  Abdominal:     General: There is no distension.     Tenderness: There is no abdominal tenderness.  Genitourinary:    Comments: No cva tenderness. Musculoskeletal:        General: No swelling.     Cervical back: Neck supple.     Right lower leg: No edema.     Left lower leg: No edema.  Skin:    General: Skin is warm and dry.     Findings: No rash.  Neurological:     Mental Status: He is alert.     Comments: Alert, speech clear.   Psychiatric:        Mood and Affect: Mood normal.     ED Results / Procedures / Treatments   Labs (all labs ordered are listed, but only abnormal results are displayed) Results for orders placed or performed during the hospital encounter of 07/26/20  Basic metabolic panel  Result Value Ref Range   Sodium 136 135 - 145 mmol/L   Potassium 3.9 3.5 - 5.1 mmol/L   Chloride 104 98 - 111 mmol/L   CO2 24 22 - 32 mmol/L   Glucose, Bld 93 70 - 99 mg/dL   BUN 22 8 - 23 mg/dL   Creatinine, Ser 0.96 0.61 - 1.24 mg/dL   Calcium 9.1 8.9 - 04.5 mg/dL   GFR, Estimated >40 >98 mL/min   Anion gap 8 5 - 15   DG Chest Portable 1 View  Result Date: 07/09/2020 CLINICAL DATA:  Exertional shortness of breath. Dizziness for the past hour. Tachycardia. EXAM: PORTABLE CHEST 1 VIEW COMPARISON:  None. FINDINGS: Mildly enlarged cardiac silhouette. Clear lungs with normal vascularity. Mild lower thoracic spine degenerative change. IMPRESSION: Mild cardiomegaly. No acute abnormality. Electronically Signed   By: Beckie Salts M.D.   On: 07/09/2020 20:07   ECHOCARDIOGRAM COMPLETE  Result  Date: 07/10/2020    ECHOCARDIOGRAM REPORT   Patient Name:   Marco White Date of Exam: 07/10/2020 Medical Rec #:  [id number]     Height:       71.0 in Accession #:    [id number]    Weight:       153.9 lb Date of Birth:  12-Feb-1956     BSA:          1.886 m Patient Age:    65 years  BP:           91/58 mmHg Patient Gender: M             HR:           70 bpm. Exam Location:  Inpatient Procedure: 2D Echo, Cardiac Doppler and Color Doppler Indications:     I48.92* Unspecified atrial flutter  History:         Patient has no prior history of Echocardiogram examinations.                  Abnormal ECG, Arrythmias:Atrial Flutter;                  Signs/Symptoms:Shortness of Breath and Dyspnea.  Sonographer:     Sheralyn Boatmanina West RDCS Referring Phys:  [id number] Tessa LernerSUNIT TOLIA Diagnosing Phys: Tessa LernerSunit Tolia DO  Sonographer Comments: Technically difficult study due to poor echo windows and suboptimal apical window. Image acquisition challenging due to respiratory motion. Extremely difficult apical views. IMPRESSIONS  1. Left ventricular ejection fraction, by estimation, is 50 to 55%. The left ventricle has low normal function. The left ventricle has no regional wall motion abnormalities. Diastolic dysfunction is not assessed due to atrial flutter.  2. Right ventricular systolic function is mildly reduced. The right ventricular size is mildly enlarged. There is normal pulmonary artery systolic pressure.  3. Right atrial size was mildly dilated.  4. The mitral valve is normal in structure. No evidence of mitral valve regurgitation. No evidence of mitral stenosis.  5. The aortic valve is tricuspid. Aortic valve regurgitation is mild to moderate. No aortic stenosis is present.  6. The inferior vena cava is dilated in size with >50% respiratory variability, suggesting right atrial pressure of 8 mmHg. FINDINGS  Left Ventricle: Left ventricular ejection fraction, by estimation, is 50 to 55%. The left ventricle has low normal function. The left  ventricle has no regional wall motion abnormalities. The left ventricular internal cavity size was normal in size. There is no left ventricular hypertrophy. Diastolic dysfunction is not assessed due to atrial flutter. Right Ventricle: The right ventricular size is mildly enlarged. No increase in right ventricular wall thickness. Right ventricular systolic function is mildly reduced. There is normal pulmonary artery systolic pressure. The tricuspid regurgitant velocity  is 1.68 m/s, and with an assumed right atrial pressure of 8 mmHg, the estimated right ventricular systolic pressure is 19.3 mmHg. Left Atrium: Left atrial size was normal in size. Right Atrium: Right atrial size was mildly dilated. Pericardium: There is no evidence of pericardial effusion. Mitral Valve: The mitral valve is normal in structure. No evidence of mitral valve regurgitation. No evidence of mitral valve stenosis. Tricuspid Valve: The tricuspid valve is normal in structure. Tricuspid valve regurgitation is trivial. No evidence of tricuspid stenosis. Aortic Valve: The aortic valve is tricuspid. Aortic valve regurgitation is mild to moderate. Aortic regurgitation PHT measures 606 msec. No aortic stenosis is present. Pulmonic Valve: The pulmonic valve was normal in structure. Pulmonic valve regurgitation is trivial. Aorta: The aortic root and ascending aorta are structurally normal, with no evidence of dilitation. Venous: The inferior vena cava is dilated in size with greater than 50% respiratory variability, suggesting right atrial pressure of 8 mmHg. IAS/Shunts: The atrial septum is grossly normal.  LEFT VENTRICLE PLAX 2D LVIDd:         4.60 cm LVIDs:         3.20 cm LV PW:         1.00 cm LV IVS:  0.90 cm LVOT diam:     2.70 cm LV SV:         74 LV SV Index:   39 LVOT Area:     5.73 cm  LV Volumes (MOD) LV vol d, MOD A4C: 78.5 ml LV vol s, MOD A4C: 26.8 ml LV SV MOD A4C:     78.5 ml RIGHT VENTRICLE            IVC RV S prime:     9.46  cm/s  IVC diam: 2.30 cm TAPSE (M-mode): 3.1 cm LEFT ATRIUM           Index       RIGHT ATRIUM           Index LA diam:      2.50 cm 1.33 cm/m  RA Area:     22.10 cm LA Vol (A4C): 34.1 ml 18.08 ml/m RA Volume:   64.50 ml  34.19 ml/m  AORTIC VALVE LVOT Vmax:   61.50 cm/s LVOT Vmean:  39.900 cm/s LVOT VTI:    0.129 m AI PHT:      606 msec  AORTA Ao Root diam: 3.50 cm MITRAL VALVE                TRICUSPID VALVE MV Area (PHT): 3.85 cm     TR Peak grad:   11.3 mmHg MV Decel Time: 197 msec     TR Vmax:        168.00 cm/s MV E velocity: 105.50 cm/s                             SHUNTS                             Systemic VTI:  0.13 m                             Systemic Diam: 2.70 cm Sunit Tolia DO Electronically signed by Tessa Lerner DO Signature Date/Time: 07/10/2020/1:55:48 PM    Final    ECHO TEE  Result Date: 07/12/2020    TRANSESOPHOGEAL ECHO REPORT   Patient Name:   Marco White Date of Exam: 07/12/2020 Medical Rec #:  [id number]     Height:       71.0 in Accession #:    [id number]    Weight:       152.6 lb Date of Birth:  1955-07-28     BSA:          1.880 m Patient Age:    64 years      BP:           114/72 mmHg Patient Gender: M             HR:           131 bpm. Exam Location:  Inpatient Procedure: Transesophageal Echo, Color Doppler and Cardiac Doppler Indications:     I48.91* Unspeicified atrial fibrillation  History:         Patient has no prior history of Echocardiogram examinations and                  Patient has prior history of Echocardiogram examinations, most                  recent 07/10/2020.  Sonographer:     Fleet Contras  Maurice March RDCS Referring Phys:  [id number] Tessa Lerner Diagnosing Phys: Tessa Lerner DO PROCEDURE: The transesophogeal probe was passed without difficulty through the esophogus of the patient. Imaged were obtained with the patient in a left lateral decubitus position. Local oropharyngeal anesthetic was provided with Cetacaine. Sedation performed by different physician. The patient was  monitored while under deep sedation. Anesthestetic sedation was provided intravenously by Anesthesiology: 349mg  of Propofol. Image quality was good. The patient's vital signs; including heart rate, blood pressure, and oxygen saturation; remained stable throughout the procedure. Supplementary images were obtained from transthoracic windows as indicated to answer the clinical question. The patient developed no complications during the procedure. A successful direct current cardioversion was performed at 150J joules with 2 attempts. IMPRESSIONS  1. Left ventricular ejection fraction, by estimation, is 50 to 55%. The left ventricle has low normal function. The left ventricle has no regional wall motion abnormalities. Diastolic function not evaluated due to rapid Atrial Flutter.  2. Right ventricular systolic function is normal. The right ventricular size is normal.  3. 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 The LAA emptying velocity was 43 cm/s.  4. Spontaneous echo contrast present in right atrium.  5. The mitral valve is normal in structure. Trivial mitral valve regurgitation. No evidence of mitral stenosis.  6. The aortic valve is tricuspid. Aortic valve regurgitation is mild. No aortic stenosis is present. Conclusion(s)/Recommendation(s): No LA/LAA thrombus identified. Successful cardioversion performed with restoration of normal sinus rhythm. FINDINGS  Left Ventricle: Left ventricular ejection fraction, by estimation, is 50 to 55%. The left ventricle has low normal function. The left ventricle has no regional wall motion abnormalities. The left ventricular internal cavity size was normal in size. There is no left ventricular hypertrophy. Diastolic function not evaluated due to rapid Atrial Flutter. Right Ventricle: The right ventricular size is normal. No increase in right ventricular wall thickness. Right ventricular systolic function is normal. Left Atrium: Left atrial size was normal in size.  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 The LAA emptying velocity was 43 cm/s. Right Atrium: Right atrial size was normal in size. Prominent Chiari network and Spontaneous echo contrast present in right atrium. Pericardium: There is no evidence of pericardial effusion. Mitral Valve: The mitral valve is normal in structure. Trivial mitral valve regurgitation. No evidence of mitral valve stenosis. Tricuspid Valve: The tricuspid valve is normal in structure. Tricuspid valve regurgitation is trivial. No evidence of tricuspid stenosis. Aortic Valve: The aortic valve is tricuspid. Aortic valve regurgitation is mild. No aortic stenosis is present. Pulmonic Valve: The pulmonic valve was normal in structure. Pulmonic valve regurgitation is trivial. No evidence of pulmonic stenosis. Aorta: The aortic root and ascending aorta are structurally normal, with no evidence of dilitation. There is minimal (Grade I) plaque involving the descending aorta and transverse aorta. IAS/Shunts: The interatrial septum was not assessed.   AORTA Ao Root diam: 3.70 cm Ao Asc diam:  2.70 cm Sunit Tolia DO Electronically signed by DO Signature Date/Time: 07/12/2020/6:11:22 PM    Final     EKG EKG Interpretation  Date/Time:  Tuesday July 26 2020 08:15:53 EDT Ventricular Rate:  81 PR Interval:    QRS Duration: 119 QT Interval:  367 QTC Calculation: 410 R Axis:   101 Text Interpretation: Atrial flutter IRBBB and LPFB Non-specific ST-t changes Confirmed by 05-14-1970 (Cathren Laine) on 07/26/2020 8:19:51 AM   Radiology No results found.  Procedures Procedures   Medications Ordered in ED Medications - No data  to display  ED Course  I have reviewed the triage vital signs and the nursing notes.  Pertinent labs & imaging results that were available during my care of the patient were reviewed by me and considered in my medical decision making (see chart for details).    MDM Rules/Calculators/A&P                           Iv ns. Continuous pulse ox and cardiac monitoring. Stat labs.   Reviewed nursing notes and prior charts for additional history.  Recent aflutter admission reviewed.   Pt indicates plan was that if aflutter recurred/persisted, possible ablation therapy.   Cardiology consulted. Discussed pt with Dr Jacinto Halim - he consulted w EP and in shared decision making with patient indicates he plans to admit and get ablation done as soon as EP team able to accommodate patient.   Labs reviewed/interpreted by me - chem/k normal.   Rate remains controlled on current meds, still in a flutter.      Final Clinical Impression(s) / ED Diagnoses Final diagnoses:  None    Rx / DC Orders ED Discharge Orders    None       Cathren Laine, MD 07/26/20 1013

## 2020-07-26 NOTE — H&P (Signed)
CARDIOLOGY ADMIT NOTE   Patient ID: Marco White MRN: [id number] DOB/AGE: July 09, 1955 65 y.o.  Admit date: 07/26/2020 Primary Physician:  Pcp, No  Patient ID: Marco White, male    DOB: [birth year], 65 y.o.   MRN: [id number]  Chief Complaint  Patient presents with  . Palpitations   HPI:    Marco White  is a 65 y.o. Caucasian male patient who is now admitted with fatigue, dyspnea on exertion and palpitations.  He was admitted to the hospital on 07/09/2020 with new onset of typical atrial flutter with RVR.  Due to inability to control his heart rate, borderline low blood pressure, he underwent TEE guided direct-current cardioversion on 07/12/2020 to sinus rhythm and discharged home on low-dose beta-blocker and Eliquis.  Over the past 2 days he started having recurrence of symptoms and last night he had persistent symptoms and this morning woke up feeling extremely fatigued, dyspneic and also with palpitations.  Upon presentation to the emergency room he was in rate controlled a flutter with variable ventricular response.  His wife Dr. Porfirio Mylar Dohmeier is present at the bedside.  Past Medical History:  Diagnosis Date  . Anemia    Past Surgical History:  Procedure Laterality Date  . CARDIOVERSION N/A 07/12/2020   Procedure: CARDIOVERSION;  Surgeon: Tessa Lerner, DO;  Location: [facility];  Service: Cardiovascular;  Laterality: N/A;  . HERNIA REPAIR    . LUMBAR MICRODISCECTOMY    . MENISCUS REPAIR    . TEE WITHOUT CARDIOVERSION N/A 07/12/2020   Procedure: TRANSESOPHAGEAL ECHOCARDIOGRAM (TEE);  Surgeon: Tessa Lerner, DO;  Location: [facility];  Service: Cardiovascular;  Laterality: N/A;   Social History   Socioeconomic History  . Marital status: Unknown    Spouse name: Not on file  . Number of children: Not on file  . Years of education: Not on file  . Highest education level: Not on file  Occupational History  . Not on file  Tobacco Use  . Smoking status: Never Smoker  .  Smokeless tobacco: Never Used  Vaping Use  . Vaping Use: Never used  Substance and Sexual Activity  . Alcohol use: Yes    Comment: Occasional  . Drug use: Never  . Sexual activity: Not on file  Other Topics Concern  . Not on file  Social History Narrative  . Not on file   Social Determinants of Health   Financial Resource Strain: Not on file  Food Insecurity: Not on file  Transportation Needs: Not on file  Physical Activity: Not on file  Stress: Not on file  Social Connections: Not on file  Intimate Partner Violence: Not on file   No family history on file.  ROS  Review of Systems  Constitutional: Positive for malaise/fatigue.  HENT: Negative.   Cardiovascular: Positive for dyspnea on exertion and irregular heartbeat. Negative for chest pain, leg swelling, palpitations and syncope.  Respiratory: Negative.   Endocrine: Negative.   Musculoskeletal: Negative.   Gastrointestinal: Negative.   Neurological: Negative.    Objective   Vitals with BMI 07/26/2020 07/26/2020 07/26/2020  Height - - 5\' 11"   Weight - - 150 lbs  BMI - - 20.93  Systolic 106 114 -  Diastolic 61 83 -  Pulse 90 90 -      Physical Exam Constitutional:      Appearance: Normal appearance.  HENT:     Head: Atraumatic.     Mouth/Throat:     Mouth: Mucous membranes are moist.  Eyes:  Extraocular Movements: Extraocular movements intact.  Cardiovascular:     Rate and Rhythm: Normal rate. Rhythm irregular.     Pulses: Normal pulses and intact distal pulses.     Heart sounds: No murmur heard. No gallop. No S3 or S4 sounds.   Pulmonary:     Effort: Pulmonary effort is normal.     Breath sounds: Normal breath sounds.  Abdominal:     General: Bowel sounds are normal.     Palpations: Abdomen is soft.  Musculoskeletal:        General: No swelling.     Cervical back: Normal range of motion.  Skin:    General: Skin is warm.  Neurological:     General: No focal deficit present.     Mental Status:  He is alert and oriented to person, place, and time.  Psychiatric:        Mood and Affect: Mood normal.    Laboratory examination:    Recent Labs    07/11/20 0457 07/12/20 0445 07/13/20 0155  NA 138 141 138  K 3.8 4.4 3.7  CL 105 105 105  CO2 27 31 26   GLUCOSE 89 95 93  BUN 26* 17 17  CREATININE 0.94 1.07 0.98  CALCIUM 9.0 9.3 8.8*  GFRNONAA >60 >60 >60   estimated creatinine clearance is 75.2 mL/min (by C-G formula based on SCr of 0.98 mg/dL).  CMP Latest Ref Rng & Units 07/13/2020 07/12/2020 07/11/2020  Glucose 70 - 99 mg/dL 93 95 89  BUN 8 - 23 mg/dL 17 17 09/10/2020)  Creatinine 0.61 - 1.24 mg/dL 29(N 9.89 2.11  Sodium 135 - 145 mmol/L 138 141 138  Potassium 3.5 - 5.1 mmol/L 3.7 4.4 3.8  Chloride 98 - 111 mmol/L 105 105 105  CO2 22 - 32 mmol/L 26 31 27   Calcium 8.9 - 10.3 mg/dL 9.41) 9.3 9.0   CBC Latest Ref Rng & Units 07/13/2020 07/12/2020 07/11/2020  WBC 4.0 - 10.5 K/uL 7.2 4.7 5.0  Hemoglobin 13.0 - 17.0 g/dL 12.6(L) 14.3 13.4  Hematocrit 39.0 - 52.0 % 38.3(L) 43.6 41.5  Platelets 150 - 400 K/uL 117(L) 118(L) 117(L)   Lipid Panel     Component Value Date/Time   CHOL 198 07/13/2020 0155   TRIG 102 07/13/2020 0155   HDL 50 07/13/2020 0155   CHOLHDL 4.0 07/13/2020 0155   VLDL 20 07/13/2020 0155   LDLCALC 128 (H) 07/13/2020 0155   LDLDIRECT 126.2 (H) 07/13/2020 0155   HEMOGLOBIN A1C Lab Results  Component Value Date   HGBA1C 5.8 (H) 07/10/2020   MPG 119.76 07/10/2020   TSH Recent Labs    07/09/20 2026  TSH 3.541   BNP (last 3 results) Recent Labs    07/09/20 1934 07/13/20 0155  BNP 83.5 42.0    Medications and allergies  No Known Allergies   Prior to Admission medications   Medication Sig Start Date End Date Taking? Authorizing Provider  apixaban (ELIQUIS) 5 MG TABS tablet Take 1 tablet (5 mg total) by mouth 2 (two) times daily. 07/13/20 08/12/20  Tolia, Sunit, DO  cholecalciferol (VITAMIN D3) 25 MCG (1000 UNIT) tablet Take 1,000 Units by mouth  daily.    [provider]  finasteride (PROSCAR) 5 MG tablet Take 1 mg by mouth daily.    [provider]  metoprolol tartrate (LOPRESSOR) 25 MG tablet Take 0.5 tablets (12.5 mg total) by mouth 2 (two) times daily. Hold if systolic blood pressure (top blood pressure number) less than 100 mmHg  or pulse 60bpm. 07/13/20 08/12/20  Tolia, Sunit, DO  NONFORMULARY OR COMPOUNDED ITEM Apply 1 application topically daily. Fluconazole 2%Terb 1% DMSO - Apply to toe nail    [provider]  silver sulfADIAZINE (SILVADENE) 1 % cream Apply topically 2 (two) times daily. 07/13/20   Tessa Lernerolia, Sunit, DO      Current Outpatient Medications  Medication Instructions  . cholecalciferol (VITAMIN D3) 1,000 Units, Oral, Daily  . Eliquis 5 mg, Oral, 2 times daily  . finasteride (PROSCAR) 1 mg, Oral, Daily  . metoprolol tartrate (LOPRESSOR) 12.5 mg, Oral, 2 times daily, Hold if systolic blood pressure (top blood pressure number) less than 100 mmHg or pulse 60bpm.  . NONFORMULARY OR COMPOUNDED ITEM 1 application, Topical, Daily, Fluconazole 2%Terb 1% DMSO - Apply to toe nail  . silver sulfADIAZINE (SILVADENE) 1 % cream Topical, 2 times daily    No intake/output data recorded. No intake/output data recorded.    Radiology:  No results found.  Cardiac Studies:   Echocardiogram: TTE 07/10/2020: Left ventricular ejection fraction, by estimation, is 50 to 55%. The  left ventricle has low normal function. The left ventricle has no regional  wall motion abnormalities. Diastolic dysfunction is not assessed due to  atrial flutter.  2. Right ventricular systolic function is mildly reduced. The right  ventricular size is mildly enlarged. There is normal pulmonary artery  systolic pressure.  3. Right atrial size was mildly dilated.  4. The mitral valve is normal in structure. No evidence of mitral valve  regurgitation. No evidence of mitral stenosis.  5. The aortic valve is tricuspid. Aortic  valve regurgitation is mild to  moderate. No aortic stenosis is present.  6. The inferior vena cava is dilated in size with >50% respiratory  variability, suggesting right atrial pressure of 8 mmHg.   TEE/direct-current cardioversion 07/12/2020: 1. Left ventricular ejection fraction, by estimation, is 50 to 55%. The  left ventricle has low normal function. The left ventricle has no regional  wall motion abnormalities. Diastolic function not evaluated due to rapid  Atrial Flutter.  2. Right ventricular systolic function is normal. The right ventricular  size is normal.  3. 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 The LAA  emptying velocity was 43 cm/s.  4. Spontaneous echo contrast present in right atrium.  5. The mitral valve is normal in structure. Trivial mitral valve  regurgitation. No evidence of mitral stenosis.  6. The aortic valve is tricuspid. Aortic valve regurgitation is mild. No  aortic stenosis is present.  7.  Successful cardioversion with 100 J followed by 150 J of energy delivered to restore normal sinus rhythm.  EKG 07/26/2020: Atrial flutter with variable AV conduction, rightward axis, poor R wave progression, cannot exclude anteroseptal infarct old.  No evidence of ischemia.  Normal QT interval.  Assessment   1.  Persistent typical atrial flutter, symptomatic. CHA2DS2-VASc Score is 0-1.  Yearly risk of stroke: <1.3% ().  Score of 1=0.6; 2=2.2; 3=3.2; 4=4.8; 5=7.2; 6=9.8; 7=>9.8) -(CHF; HTN; vasc disease DM,  Male = 1; Age <65 =0; 65-74 = 1,  >75 =2; stroke/embolism= 2).    Recommendations:   Patient has symptomatic typical atrial flutter.  He has low cardioembolic risk.  He is presently on Eliquis for the same after he had direct-current cardioversion on 07/12/2020.  In view of recurrence, patient preference, I have consulted EP for consideration for atrial flutter ablation.  Will admit the patient for further evaluation and management.  I  do not  think he has ischemic heart disease, low risk from vascular standpoint.  In view of his young age, need for long-term antiarrhythmic therapy, patient preference, ablation would probably be the best option.  Yates Decamp, MD, [facility] 07/26/2020, 9:26 AM [facility] [place] Pager: [phone] Office: [phone]

## 2020-07-27 ENCOUNTER — Inpatient Hospital Stay (HOSPITAL_COMMUNITY): Payer: PRIVATE HEALTH INSURANCE | Admitting: Anesthesiology

## 2020-07-27 ENCOUNTER — Encounter (HOSPITAL_COMMUNITY)
Admission: EM | Disposition: A | Discharge status: Home / Self Care | Payer: Self-pay | Source: Home / Self Care | Attending: Cardiology

## 2020-07-27 ENCOUNTER — Encounter: Payer: Self-pay | Admitting: Cardiology

## 2020-07-27 HISTORY — PX: A-FLUTTER ABLATION: EP1230

## 2020-07-27 SURGERY — A-FLUTTER ABLATION
Anesthesia: General

## 2020-07-27 MED ORDER — FENTANYL CITRATE (PF) 100 MCG/2ML IJ SOLN
INTRAMUSCULAR | Status: DC | PRN
  Administered 2020-07-27: 100 ug via INTRAVENOUS

## 2020-07-27 MED ORDER — SUGAMMADEX SODIUM 200 MG/2ML IV SOLN
INTRAVENOUS | Status: DC | PRN
  Administered 2020-07-27: 200 mg via INTRAVENOUS

## 2020-07-27 MED ORDER — SODIUM CHLORIDE 0.9 % IV SOLN
INTRAVENOUS | Status: DC
  Administered 2020-07-27: 50 mL/h via INTRAVENOUS

## 2020-07-27 MED ORDER — SODIUM CHLORIDE 0.9% FLUSH: 3.0000 mL | INTRAVENOUS | Status: DC | PRN

## 2020-07-27 MED ORDER — GLYCOPYRROLATE PF 0.2 MG/ML IJ SOSY
PREFILLED_SYRINGE | INTRAMUSCULAR | Status: DC | PRN
  Administered 2020-07-27: .2 mg via INTRAVENOUS

## 2020-07-27 MED ORDER — MIDAZOLAM HCL 2 MG/2ML IJ SOLN
INTRAMUSCULAR | Status: DC | PRN
  Administered 2020-07-27: 2 mg via INTRAVENOUS

## 2020-07-27 MED ORDER — PHENYLEPHRINE HCL-NACL 10-0.9 MG/250ML-% IV SOLN
INTRAVENOUS | Status: DC | PRN
  Administered 2020-07-27: 25 ug/min via INTRAVENOUS

## 2020-07-27 MED ORDER — LIDOCAINE 2% (20 MG/ML) 5 ML SYRINGE
INTRAMUSCULAR | Status: DC | PRN
  Administered 2020-07-27: 60 mg via INTRAVENOUS

## 2020-07-27 MED ORDER — HEPARIN (PORCINE) IN NACL 1000-0.9 UT/500ML-% IV SOLN
INTRAVENOUS | Status: DC | PRN
  Administered 2020-07-27 (×2): 500 mL

## 2020-07-27 MED ORDER — ROCURONIUM BROMIDE 10 MG/ML (PF) SYRINGE
PREFILLED_SYRINGE | INTRAVENOUS | Status: DC | PRN
  Administered 2020-07-27: 60 mg via INTRAVENOUS

## 2020-07-27 MED ORDER — SODIUM CHLORIDE 0.9% FLUSH
3.0000 mL | Freq: Two times a day (BID) | INTRAVENOUS | Status: DC
  Administered 2020-07-27 – 2020-07-28 (×2): 3 mL via INTRAVENOUS

## 2020-07-27 MED ORDER — PROPOFOL 10 MG/ML IV BOLUS
INTRAVENOUS | Status: DC | PRN
  Administered 2020-07-27: 120 mg via INTRAVENOUS

## 2020-07-27 MED ORDER — SODIUM CHLORIDE 0.9 % IV SOLN: 250.0000 mL | INTRAVENOUS | Status: DC | PRN

## 2020-07-27 SURGICAL SUPPLY — 11 items
CATH EZ STEER NAV 8MM D-F CUR (ABLATOR) ×1 IMPLANT
CATH WEB BI DIR CSDF CRV REPRO (CATHETERS) ×1 IMPLANT
CLOSURE PERCLOSE PROSTYLE (VASCULAR PRODUCTS) ×2 IMPLANT
PACK EP LATEX FREE (CUSTOM PROCEDURE TRAY) ×2
PACK EP LF (CUSTOM PROCEDURE TRAY) ×1 IMPLANT
PAD PRO RADIOLUCENT 2001M-C (PAD) ×2 IMPLANT
PATCH CARTO3 (PAD) ×1 IMPLANT
SHEATH PINNACLE 7F 10CM (SHEATH) ×1 IMPLANT
SHEATH PINNACLE 8F 10CM (SHEATH) ×1 IMPLANT
SHEATH PROBE COVER 6X72 (BAG) ×1 IMPLANT
SHEATH SWARTZ RAMP 8.5F 60CM (SHEATH) ×1 IMPLANT

## 2020-07-27 NOTE — Anesthesia Postprocedure Evaluation (Signed)
Anesthesia Post Note  Patient: Marco White  Procedure(s) Performed: A-FLUTTER ABLATION (N/A )     Patient location during evaluation: PACU Anesthesia Type: General Level of consciousness: awake and alert Pain management: pain level controlled Vital Signs Assessment: post-procedure vital signs reviewed and stable Respiratory status: spontaneous breathing, nonlabored ventilation, respiratory function stable and patient connected to nasal cannula oxygen Cardiovascular status: blood pressure returned to baseline and stable Postop Assessment: no apparent nausea or vomiting Anesthetic complications: no   No complications documented.  Last Vitals:  Vitals:   07/27/20 1640 07/27/20 1645  BP: (!) 107/56 108/60  Pulse: (!) 56 (!) 51  Resp: 16 13  Temp:    SpO2: 100% 100%    Last Pain:  Vitals:   07/27/20 1559  TempSrc:   PainSc: 0-No pain                 Kennieth Rad

## 2020-07-27 NOTE — Anesthesia Procedure Notes (Signed)
Procedure Name: Intubation Date/Time: 07/27/2020 2:33 PM Performed by: Marena Chancy, CRNA Pre-anesthesia Checklist: Patient identified, Emergency Drugs available, Suction available and Patient being monitored Patient Re-evaluated:Patient Re-evaluated prior to induction Oxygen Delivery Method: Circle System Utilized Preoxygenation: Pre-oxygenation with 100% oxygen Induction Type: IV induction and Cricoid Pressure applied Ventilation: Mask ventilation without difficulty Laryngoscope Size: Miller and 2 Grade View: Grade II Tube type: Oral Tube size: 7.5 mm Number of attempts: 1 Airway Equipment and Method: Stylet and Oral airway Placement Confirmation: ETT inserted through vocal cords under direct vision,  positive ETCO2 and breath sounds checked- equal and bilateral Tube secured with: Tape Dental Injury: Teeth and Oropharynx as per pre-operative assessment

## 2020-07-27 NOTE — Progress Notes (Signed)
Subjective:   Feels tired but states that he is pleased that he had ablation and it was successful.  Denies chest pain or dyspnea.  Intake/Output from previous day:  I/O last 3 completed shifts: In: 360 [P.O.:360] Out: -  Total I/O In: 457.5 [I.V.:457.5] Out: 1 [Blood:1]  Blood pressure 113/65, pulse (!) 57, temperature 97.6 F (36.4 C), temperature source Oral, resp. rate 13, height 5\' 11"  (1.803 m), weight 67.6 kg, SpO2 99 %. Physical Exam Constitutional:      Appearance: Normal appearance.  Eyes:     Extraocular Movements: Extraocular movements intact.  Neck:     Vascular: No carotid bruit or JVD.  Cardiovascular:     Rate and Rhythm: Normal rate and regular rhythm.     Pulses: Intact distal pulses.     Heart sounds: Normal heart sounds. No murmur heard. No gallop.      Comments: Right groin site without any hematoma. Pulmonary:     Effort: Pulmonary effort is normal.     Breath sounds: Normal breath sounds.  Abdominal:     General: Bowel sounds are normal.     Palpations: Abdomen is soft.  Musculoskeletal:        General: Normal range of motion.     Right lower leg: No edema.     Left lower leg: No edema.  Skin:    General: Skin is warm.     Capillary Refill: Capillary refill takes less than 2 seconds.  Neurological:     General: No focal deficit present.     Mental Status: He is alert and oriented to person, place, and time.    Lab Results:  BNP (last 3 results) Recent Labs    07/09/20 1934 07/13/20 0155  BNP 83.5 42.0    BMP Latest Ref Rng & Units 07/26/2020 07/13/2020 07/12/2020  Glucose 70 - 99 mg/dL 93 93 95  BUN 8 - 23 mg/dL 22 17 17   Creatinine 0.61 - 1.24 mg/dL 09/11/2020 6.06  Sodium 135 - 145 mmol/L 136 138 141  Potassium 3.5 - 5.1 mmol/L 3.9 3.7 4.4  Chloride 98 - 111 mmol/L 104 105 105  CO2 22 - 32 mmol/L 24 26 31   Calcium 8.9 - 10.3 mg/dL 9.1 3.01) 9.3   No flowsheet data found. CBC Latest Ref Rng & Units 07/26/2020 07/13/2020 07/12/2020   WBC 4.0 - 10.5 K/uL 4.3 7.2 4.7  Hemoglobin 13.0 - 17.0 g/dL 07/28/2020 12.6(L) 14.3  Hematocrit 39.0 - 52.0 % 45.0 38.3(L) 43.6  Platelets 150 - 400 K/uL 137(L) 117(L) 118(L)   Lipid Panel     Component Value Date/Time   CHOL 198 07/13/2020 0155   TRIG 102 07/13/2020 0155   HDL 50 07/13/2020 0155   CHOLHDL 4.0 07/13/2020 0155   VLDL 20 07/13/2020 0155   LDLCALC 128 (H) 07/13/2020 0155   LDLDIRECT 126.2 (H) 07/13/2020 0155   Cardiac Panel (last 3 results) No results for input(s): CKTOTAL, CKMB, TROPONINI, RELINDX in the last 72 hours.  HEMOGLOBIN A1C Lab Results  Component Value Date   HGBA1C 5.8 (H) 07/10/2020   MPG 119.76 07/10/2020   TSH Recent Labs    07/09/20 2026  TSH 3.541   Cardiac Studies:   Echocardiogram: TTE 07/10/2020: Left ventricular ejection fraction, by estimation, is 50 to 55%. The  left ventricle has low normal function. The left ventricle has no regional  wall motion abnormalities. Diastolic dysfunction is not assessed due to  atrial flutter.  2. Right ventricular systolic function  is mildly reduced. The right  ventricular size is mildly enlarged. There is normal pulmonary artery  systolic pressure.  3. Right atrial size was mildly dilated.  4. The mitral valve is normal in structure. No evidence of mitral valve  regurgitation. No evidence of mitral stenosis.  5. The aortic valve is tricuspid. Aortic valve regurgitation is mild to  moderate. No aortic stenosis is present.  6. The inferior vena cava is dilated in size with >50% respiratory  variability, suggesting right atrial pressure of 8 mmHg.   TEE/direct-current cardioversion04/03/2021: 1. Left ventricular ejection fraction, by estimation, is 50 to 55%. The  left ventricle has low normal function. The left ventricle has no regional  wall motion abnormalities. Diastolic function not evaluated due to rapid  Atrial Flutter.  2. Right ventricular systolic function is normal. The right  ventricular  size is normal.  3. 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 The LAA  emptying velocity was 43 cm/s.  4. Spontaneous echo contrast present in right atrium.  5. The mitral valve is normal in structure. Trivial mitral valve  regurgitation. No evidence of mitral stenosis.  6. The aortic valve is tricuspid. Aortic valve regurgitation is mild. No  aortic stenosis is present.  7.Successful cardioversion with 100 J followed by 150 J of energy delivered to restore normal sinus rhythm.  Atrial flutter ablation 07/27/2020: 1. Isthmus-dependent right atrial flutter upon presentation.  2. Successful radiofrequency ablation of atrial flutter along the cavotricuspid isthmus with complete bidirectional isthmus block achieved.  3. No inducible arrhythmias following ablation.  4. No early apparent complications.   EKG    EKG 07/26/2020: Atrial flutter with variable AV conduction, rightward axis, poor R wave progression, cannot exclude anteroseptal infarct old.  No evidence of ischemia.  Normal QT interval.  Scheduled Meds: . apixaban  5 mg Oral BID  . finasteride  2.5 mg Oral Daily  . metoprolol tartrate  12.5 mg Oral BID  . sodium chloride flush  3 mL Intravenous Q12H   Continuous Infusions: . sodium chloride 50 mL/hr (07/27/20 1612)  . sodium chloride     PRN Meds:.sodium chloride, acetaminophen, ondansetron (ZOFRAN) IV, sodium chloride flush  Assessment/Plan:  1.  Typical atrial flutter SP successful atrial flutter ablation on 07/27/2020 2.  Mild hyperlipidemia  Recommendation: Patient just returned from EP lab this afternoon, resting well.  He is maintaining sinus rhythm.  Continue anticoagulation with apixaban and also low-dose beta-blocker for now.  He will be discharged home in the morning if he remains stable.   Yates Decamp, MD, [facility] 07/27/2020, 6:25 PM Office: [phone] Pager: [phone]

## 2020-07-27 NOTE — Transfer of Care (Signed)
Immediate Anesthesia Transfer of Care Note  Patient: Marco White  Procedure(s) Performed: A-FLUTTER ABLATION (N/A )  Patient Location: Cath Lab  Anesthesia Type:General  Level of Consciousness: awake, alert  and oriented  Airway & Oxygen Therapy: Patient Spontanous Breathing and Patient connected to nasal cannula oxygen  Post-op Assessment: Report given to RN, Post -op Vital signs reviewed and stable and Patient moving all extremities X 4  Post vital signs: Reviewed and stable  Last Vitals:  Vitals Value Taken Time  BP 101/81 07/27/20 1555  Temp    Pulse 62 07/27/20 1558  Resp 13 07/27/20 1558  SpO2 100 % 07/27/20 1558  Vitals shown include unvalidated device data.  Last Pain:  Vitals:   07/27/20 0917  TempSrc: Oral  PainSc: 0-No pain      Patients Stated Pain Goal: 0 (07/26/20 2000)  Complications: No complications documented.

## 2020-07-27 NOTE — Anesthesia Preprocedure Evaluation (Signed)
Anesthesia Evaluation  Patient identified by MRN, date of birth, ID band Patient awake    Reviewed: Allergy & Precautions, NPO status , Patient's Chart, lab work & pertinent test results  Airway Mallampati: II  TM Distance: >3 FB Neck ROM: Full    Dental  (+) Dental Advisory Given   Pulmonary neg pulmonary ROS,    breath sounds clear to auscultation       Cardiovascular + dysrhythmias Atrial Fibrillation  Rhythm:Regular Rate:Normal     Neuro/Psych negative neurological ROS     GI/Hepatic negative GI ROS, Neg liver ROS,   Endo/Other  negative endocrine ROS  Renal/GU negative Renal ROS     Musculoskeletal   Abdominal   Peds  Hematology negative hematology ROS (+)   Anesthesia Other Findings   Reproductive/Obstetrics                             Anesthesia Physical Anesthesia Plan  ASA: II  Anesthesia Plan: General   Post-op Pain Management:    Induction: Intravenous  PONV Risk Score and Plan: 2 and Dexamethasone, Ondansetron and Treatment may vary due to age or medical condition  Airway Management Planned: Oral ETT  Additional Equipment:   Intra-op Plan:   Post-operative Plan: Extubation in OR  Informed Consent: I have reviewed the patients History and Physical, chart, labs and discussed the procedure including the risks, benefits and alternatives for the proposed anesthesia with the patient or authorized representative who has indicated his/her understanding and acceptance.     Dental advisory given  Plan Discussed with: CRNA  Anesthesia Plan Comments:         Anesthesia Quick Evaluation

## 2020-07-27 NOTE — Progress Notes (Addendum)
Progress Note  Patient Name: Marco White Date of Encounter: 07/27/2020  [facility] Cardiologist: Dr. Odis White  Subjective   Thinks he can tell the difference, not SOB  Inpatient Medications    Scheduled Meds: . apixaban  5 mg Oral BID  . finasteride  2.5 mg Oral Daily  . metoprolol tartrate  12.5 mg Oral BID   Continuous Infusions: . sodium chloride 50 mL/hr at 07/27/20 0545   PRN Meds: acetaminophen, ondansetron (ZOFRAN) IV   Vital Signs    Vitals:   07/26/20 1107 07/26/20 1123 07/26/20 2115 07/27/20 0456  BP: 121/84 96/65 (!) 96/56 100/64  Pulse: 77 68 96 64  Resp: 16  16 18   Temp: 98.9 F (37.2 C) 97.6 F (36.4 C) 98.2 F (36.8 C) 97.9 F (36.6 C)  TempSrc: Oral Oral Oral Oral  SpO2: 99% 99%  97%  Weight:    67.6 kg  Height:        Intake/Output Summary (Last 24 hours) at 07/27/2020 0833 Last data filed at 07/26/2020 2000 Gross per 24 hour  Intake 360 ml  Output --  Net 360 ml   Last 3 Weights 07/27/2020 07/26/2020 07/13/2020  Weight (lbs) 149 lb 150 lb 152 lb 1.6 oz  Weight (kg) 67.586 kg 68.04 kg 68.992 kg      Telemetry    SR 60's this - Personally Reviewed  ECG    No new EKGs - Personally Reviewed  Physical Exam   GEN: No acute distress.   Neck: No JVD Cardiac: RRR, no murmurs, rubs, or gallops.  Respiratory: CTA n/l. GI: Soft, nontender, non-distended  MS: No edema; No deformity. Neuro:  Nonfocal  Psych: Normal affect   Labs    High Sensitivity Troponin:   Recent Labs  Lab 07/09/20 1934 07/09/20 2151 07/10/20 0426 07/10/20 0705  TROPONINIHS 6 6 6 6       Chemistry Recent Labs  Lab 07/26/20 0843  NA 136  K 3.9  CL 104  CO2 24  GLUCOSE 93  BUN 22  CREATININE 0.89  CALCIUM 9.1  GFRNONAA >60  ANIONGAP 8     Hematology Recent Labs  Lab 07/26/20 0843  WBC 4.3  RBC 5.11  HGB 15.0  HCT 45.0  MCV 88.1  MCH 29.4  MCHC 33.3  RDW 12.7  PLT 137*    BNPNo results for input(s): BNP, PROBNP in the last 168  hours.   DDimer No results for input(s): DDIMER in the last 168 hours.   Radiology    No results found.  Cardiac Studies   TEE/DCCV 07/12/20 LV:low -normal EF. RV: Normal structure and function. LA: Grossly normal. Spontaneous echo contrast was notpresent. No thrombus present. Left atrial appendage: Spontaneous echo contrast wasnotpresent. No thrombus present. RA: Grossly normal. Spontaneous echo contrast was present.  07/28/20 regurgitation,nostenosis, no vegetation noted.  09/11/20 regurgitation,nostenosis, no vegetation noted. DJ:MEQASTM, no vegetation noted.  HD:QQIWLNL regurgitation,nostenosis, no vegetation noted. Thoracic and ascending aorta:Mildplaque. Cardioverted2time(s).  Cardioversion with synchronized biphasic100J followed by150Jshock. Evaluation: Findings: Post procedure EKG shows:NSR Complications:None  07/10/2020: 1. Left ventricular ejection fraction, by estimation, is 50 to 55%. The  left ventricle has low normal function. The left ventricle has no regional  wall motion abnormalities. Diastolic dysfunction is not assessed White to  atrial flutter.  2. Right ventricular systolic function is mildly reduced. The right  ventricular size is mildly enlarged. There is normal pulmonary artery  systolic pressure.  3. Right atrial size was mildly dilated.  4. The mitral valve  is normal in structure. No evidence of mitral valve  regurgitation. No evidence of mitral stenosis.  5. The aortic valve is tricuspid. Aortic valve regurgitation is mild to  moderate. No aortic stenosis is present.  6. The inferior vena cava is dilated in size with >50% respiratory  variability, suggesting right atrial pressure of 8 mmHg.    Patient Profile     65 y.o. male anemia/chronic thrombocytopenia, and new Aflutter, ?pre-DM,  admitted with palpitations, and recurrent AFlutter  Assessment & Plan    1. Aflutter (typical)      CHA2DS2Vasc is zero.  There is mention of "pre-DM" Score of one?     On Eliquis, appropriately dosed, he reports no missed doses of his Eliqus at home     Spontaneous conversion last night about MN or so  He would prefer to avoid AADs and would like to pursue ablation  Dr. Elberta White has seen and examined the patient this morning, revisited AFlutter ablation procedure, potential risks/benefots, he remains agreeable to proceed  Anticipate discharge tomorrow from EP perspective  further with Dr. Jacinto White   For questions or updates, please contact [facility] Please consult [email or web address] for contact info under        Signed, Marco Pigeon, PA-C  07/27/2020, 8:33 AM    I have seen and examined this patient with Marco White.  Agree with above, note added to reflect my findings.  On exam, RRR, no murmurs, lungs clear. Patient converted to sinus rhythm and feeling well. Marco White plan for atrial flutter ablation today.    Marco White M. Marco Due MD 07/27/2020 11:13 AM

## 2020-07-27 NOTE — H&P (View-Only) (Signed)
Progress Note  Patient Name: Marco White Date of Encounter: 07/27/2020  [facility] Cardiologist: Dr. Odis Hollingshead  Subjective   Thinks he can tell the difference, not SOB  Inpatient Medications    Scheduled Meds: . apixaban  5 mg Oral BID  . finasteride  2.5 mg Oral Daily  . metoprolol tartrate  12.5 mg Oral BID   Continuous Infusions: . sodium chloride 50 mL/hr at 07/27/20 0545   PRN Meds: acetaminophen, ondansetron (ZOFRAN) IV   Vital Signs    Vitals:   07/26/20 1107 07/26/20 1123 07/26/20 2115 07/27/20 0456  BP: 121/84 96/65 (!) 96/56 100/64  Pulse: 77 68 96 64  Resp: 16  16 18   Temp: 98.9 F (37.2 C) 97.6 F (36.4 C) 98.2 F (36.8 C) 97.9 F (36.6 C)  TempSrc: Oral Oral Oral Oral  SpO2: 99% 99%  97%  Weight:    67.6 kg  Height:        Intake/Output Summary (Last 24 hours) at 07/27/2020 0833 Last data filed at 07/26/2020 2000 Gross per 24 hour  Intake 360 ml  Output --  Net 360 ml   Last 3 Weights 07/27/2020 07/26/2020 07/13/2020  Weight (lbs) 149 lb 150 lb 152 lb 1.6 oz  Weight (kg) 67.586 kg 68.04 kg 68.992 kg      Telemetry    SR 60's this - Personally Reviewed  ECG    No new EKGs - Personally Reviewed  Physical Exam   GEN: No acute distress.   Neck: No JVD Cardiac: RRR, no murmurs, rubs, or gallops.  Respiratory: CTA n/l. GI: Soft, nontender, non-distended  MS: No edema; No deformity. Neuro:  Nonfocal  Psych: Normal affect   Labs    High Sensitivity Troponin:   Recent Labs  Lab 07/09/20 1934 07/09/20 2151 07/10/20 0426 07/10/20 0705  TROPONINIHS 6 6 6 6       Chemistry Recent Labs  Lab 07/26/20 0843  NA 136  K 3.9  CL 104  CO2 24  GLUCOSE 93  BUN 22  CREATININE 0.89  CALCIUM 9.1  GFRNONAA >60  ANIONGAP 8     Hematology Recent Labs  Lab 07/26/20 0843  WBC 4.3  RBC 5.11  HGB 15.0  HCT 45.0  MCV 88.1  MCH 29.4  MCHC 33.3  RDW 12.7  PLT 137*    BNPNo results for input(s): BNP, PROBNP in the last 168  hours.   DDimer No results for input(s): DDIMER in the last 168 hours.   Radiology    No results found.  Cardiac Studies   TEE/DCCV 07/12/20 LV:low -normal EF. RV: Normal structure and function. LA: Grossly normal. Spontaneous echo contrast was notpresent. No thrombus present. Left atrial appendage: Spontaneous echo contrast wasnotpresent. No thrombus present. RA: Grossly normal. Spontaneous echo contrast was present.  07/28/20 regurgitation,nostenosis, no vegetation noted.  09/11/20 regurgitation,nostenosis, no vegetation noted. DJ:MEQASTM, no vegetation noted.  HD:QQIWLNL regurgitation,nostenosis, no vegetation noted. Thoracic and ascending aorta:Mildplaque. Cardioverted2time(s).  Cardioversion with synchronized biphasic100J followed by150Jshock. Evaluation: Findings: Post procedure EKG shows:NSR Complications:None  07/10/2020: 1. Left ventricular ejection fraction, by estimation, is 50 to 55%. The  left ventricle has low normal function. The left ventricle has no regional  wall motion abnormalities. Diastolic dysfunction is not assessed due to  atrial flutter.  2. Right ventricular systolic function is mildly reduced. The right  ventricular size is mildly enlarged. There is normal pulmonary artery  systolic pressure.  3. Right atrial size was mildly dilated.  4. The mitral valve  is normal in structure. No evidence of mitral valve  regurgitation. No evidence of mitral stenosis.  5. The aortic valve is tricuspid. Aortic valve regurgitation is mild to  moderate. No aortic stenosis is present.  6. The inferior vena cava is dilated in size with >50% respiratory  variability, suggesting right atrial pressure of 8 mmHg.    Patient Profile     64 y.o. male anemia/chronic thrombocytopenia, and new Aflutter, ?pre-DM,  admitted with palpitations, and recurrent AFlutter  Assessment & Plan    1. Aflutter (typical)      CHA2DS2Vasc is zero.  There is mention of "pre-DM" Score of one?     On Eliquis, appropriately dosed, he reports no missed doses of his Eliqus at home     Spontaneous conversion last night about MN or so  He would prefer to avoid AADs and would like to pursue ablation  Dr. Ennis Delpozo has seen and examined the patient this morning, revisited AFlutter ablation procedure, potential risks/benefots, he remains agreeable to proceed  Anticipate discharge tomorrow from EP perspective  further with Dr. Ganji   For questions or updates, please contact [facility] Please consult [email or web address] for contact info under        Signed, Renee Lynn Ursuy, PA-C  07/27/2020, 8:33 AM    I have seen and examined this patient with Renee Ursuy.  Agree with above, note added to reflect my findings.  On exam, RRR, no murmurs, lungs clear. Patient converted to sinus rhythm and feeling well. Florabelle Cardin plan for atrial flutter ablation today.    Jacey Eckerson M. Orhan Mayorga MD 07/27/2020 11:13 AM   

## 2020-07-27 NOTE — Interval H&P Note (Signed)
History and Physical Interval Note:  07/27/2020 11:14 AM  Lester Kinsman  has presented today for surgery, with the diagnosis of aflutter.  The various methods of treatment have been discussed with the patient and family. After consideration of risks, benefits and other options for treatment, the patient has consented to  Procedure(s): A-FLUTTER ABLATION (N/A) as a surgical intervention.  The patient's history has been reviewed, patient examined, no change in status, stable for surgery.  I have reviewed the patient's chart and labs.  Questions were answered to the patient's satisfaction.     Bence Trapp [facility]  Angelo Prindle has presented today for surgery, with the diagnosis of atrial flutter.  The various methods of treatment have been discussed with the patient and family. After consideration of risks, benefits and other options for treatment, the patient has consented to  Procedure(s): Catheter ablation as a surgical intervention .  Risks include but not limited to complete heart block, stroke, esophageal damage, nerve damage, bleeding, vascular damage, tamponade, perforation, MI, and death. The patient's history has been reviewed, patient examined, no change in status, stable for surgery.  I have reviewed the patient's chart and labs.  Questions were answered to the patient's satisfaction.    Zebulin Siegel Elberta Fortis, MD 07/27/2020 11:14 AM

## 2020-07-28 ENCOUNTER — Encounter (HOSPITAL_COMMUNITY): Payer: Self-pay | Admitting: Cardiology

## 2020-07-28 NOTE — Progress Notes (Signed)
Pt is alert and oriented. Discharge instructions/ AVS given to pt. 

## 2020-07-28 NOTE — Progress Notes (Addendum)
Progress Note  Patient Name: Marco White Date of Encounter: 07/28/2020  [facility] Cardiologist: Dr. Odis Hollingshead  Subjective   Feels well. No CP,  SOB no site dicomfort  Inpatient Medications    Scheduled Meds:  apixaban  5 mg Oral BID   finasteride  2.5 mg Oral Daily   sodium chloride flush  3 mL Intravenous Q12H   Continuous Infusions:  sodium chloride Stopped (07/27/20 2021)   sodium chloride     PRN Meds: sodium chloride, acetaminophen, ondansetron (ZOFRAN) IV, sodium chloride flush   Vital Signs    Vitals:   07/28/20 0520 07/28/20 0621 07/28/20 0820 07/28/20 0847  BP: 92/60  108/61   Pulse: 67  62 62  Resp:    16  Temp: 98 F (36.7 C) 98 F (36.7 C)  98.7 F (37.1 C)  TempSrc: Oral Oral  Oral  SpO2: 95% 97% 98% 98%  Weight:      Height:        Intake/Output Summary (Last 24 hours) at 07/28/2020 0920 Last data filed at 07/27/2020 2000 Gross per 24 hour  Intake 817.52 ml  Output 1 ml  Net 816.52 ml   Last 3 Weights 07/27/2020 07/26/2020 07/13/2020  Weight (lbs) 149 lb 150 lb 152 lb 1.6 oz  Weight (kg) 67.586 kg 68.04 kg 68.992 kg      Telemetry    SB/SR nocturnal 40's-60's- Personally Reviewed  ECG    SR/low atrial rhythm 50bpm (unchanged from pre-procedure)  - Personally Reviewed  Physical Exam   Examined by Dr. Elberta Fortis GEN: No acute distress.   Neck: No JVD Cardiac: RRR, no murmurs, rubs, or gallops.  R groin site is stable, no bleeding, no hematoma, non-tender Respiratory: CTA n/l. GI: Soft, nontender, non-distended  MS: No edema; No deformity. Neuro:  Nonfocal  Psych: Normal affect   Labs    High Sensitivity Troponin:   Recent Labs  Lab 07/09/20 1934 07/09/20 2151 07/10/20 0426 07/10/20 0705  TROPONINIHS 6 6 6 6       Chemistry Recent Labs  Lab 07/26/20 0843  NA 136  K 3.9  CL 104  CO2 24  GLUCOSE 93  BUN 22  CREATININE 0.89  CALCIUM 9.1  GFRNONAA >60  ANIONGAP 8     Hematology Recent Labs  Lab 07/26/20 0843   WBC 4.3  RBC 5.11  HGB 15.0  HCT 45.0  MCV 88.1  MCH 29.4  MCHC 33.3  RDW 12.7  PLT 137*    BNPNo results for input(s): BNP, PROBNP in the last 168 hours.   DDimer No results for input(s): DDIMER in the last 168 hours.   Radiology      Cardiac Studies   TEE/DCCV 07/12/20 LV: low -normal EF. RV: Normal structure and function. LA: Grossly normal.  Spontaneous echo contrast was not present.  No thrombus present. Left atrial appendage: Spontaneous echo contrast was not present.  No thrombus present. RA: Grossly normal. Spontaneous echo contrast was present.   MV: trivial regurgitation,no stenosis, no vegetation noted.  TV: trivial regurgitation, nostenosis, no vegetation noted. AV: Mild Regurgitation, no stenosis, no vegetation noted.   PV: Trivial regurgitation, no stenosis, no vegetation noted. Thoracic and ascending aorta: Mild plaque. Cardioverted 2 time(s).  Cardioversion with synchronized biphasic 100J followed by 150J shock. Evaluation: Findings: Post procedure EKG shows: NSR Complications: None  07/10/2020:  1. Left ventricular ejection fraction, by estimation, is 50 to 55%. The  left ventricle has low normal function. The left ventricle has no  regional  wall motion abnormalities. Diastolic dysfunction is not assessed due to  atrial flutter.   2. Right ventricular systolic function is mildly reduced. The right  ventricular size is mildly enlarged. There is normal pulmonary artery  systolic pressure.   3. Right atrial size was mildly dilated.   4. The mitral valve is normal in structure. No evidence of mitral valve  regurgitation. No evidence of mitral stenosis.   5. The aortic valve is tricuspid. Aortic valve regurgitation is mild to  moderate. No aortic stenosis is present.   6. The inferior vena cava is dilated in size with >50% respiratory  variability, suggesting right atrial pressure of 8 mmHg.    Patient Profile     65 y.o. male anemia/chronic  thrombocytopenia, and new Aflutter, ?pre-DM,  admitted with palpitations, and recurrent AFlutter  Assessment & Plan    1. Aflutter (typical)     CHA2DS2Vasc is zero.  There is mention of "pre-DM" Score of one?     On Eliquis, appropriately dosed, he reports no missed doses of his Eliqus at home     Spontaneous conversion to SR   He would prefer to avoid AADs and would like to pursue ablation  S/p EPS/ablation yesterday with Dr. Elberta Fortis Site is stable Continue Eliquis uninterrupted  Routine EP follow up is in place, Wrenn Willcox re-visit OAC then Stop metoprolol  Dr. Elberta Fortis has seen and examined the patient this morning, reviewed post procedure activity restrictions and site care with the patient OK to discharge from our perspective  For questions or updates, please contact [facility] Please consult [email or web address] for contact info under        Signed, Sheilah Pigeon, PA-C  07/28/2020, 9:20 AM    I have seen and examined this patient with [name].  Agree with above, note added to reflect my findings.  On exam, RRR, no murmurs. Patient remains in sinus rhythm after atrial flutter ablation yesterday.  We Andreia Gandolfi arrange for follow-up in EP clinic.  Would continue anticoagulation until he returns to EP clinic in 1 month.  Sheriden Archibeque M. Sheala Dosh MD 07/28/2020 9:54 AM

## 2020-07-28 NOTE — Discharge Instructions (Addendum)
Post procedure care instructions OK [address] drive 2/83/15. No lifting over 5 lbs for 1 week. No vigorous or sexual activity for 1 week. You may return to work/your usual activities on 08/03/20 . Keep procedure site clean & dry. If you notice increased pain, swelling, bleeding or pus, call/return!  You may shower after 24 hours, but no soaking in baths/hot tubs/pools for 1 week.

## 2020-07-28 NOTE — Discharge Summary (Signed)
Physician Discharge Summary  Patient ID: Marco White MRN: [id number] DOB/AGE: January 08, 1956 65 y.o.  Admit date: 07/26/2020 Discharge date: 07/28/2020  Primary Discharge Diagnosis:  Atypical atrial flutter, status post EP study and radiofrequency ablation for supraventricular tachycardia.  Secondary Discharge Diagnosis: Aortic regurgitation, mild/moderate. Prediabetes. History of anemia. History of chronic thrombocytopenia, Hyperlipidemia, mild  Procedures performed: 1. Comprehensive EP study.   2. Coronary sinus pacing and recording.   3. Mapping of supraventricular tachycardia.   4. Radiofrequency ablation of supraventricular tachycardia.    Hospital Course:   65 y.o. Caucasian male  with recent onset of atrial flutter presents to the hospital for symptoms of tachycardia.  Patient was recently discharged on 07/13/2020 after undergoing TEE guided cardioversion for symptomatic atrial flutter.  He was normal sinus at the time of discharge was doing well with the plan of being on oral anticoagulation for 4 weeks and thereafter discontinuing it.  However, on the day of admission he woke up experiencing tachycardia and was brought to the hospital for further evaluation and management.  He was in atrial flutter and was evaluated by cardiac electrophysiology for possible atrial fibrillation ablation as he chose not to be on antiarrhythmic medications.  Cardiac electrophysiology evaluation and assessment greatly appreciated.  Patient underwent a comprehensive EP study along with radiofrequency ablation on 07/27/2020.  He has done well post procedure and no immediate complications.  Patient remains in sinus rhythm.  And procedure site is healing appropriately.  He is overall asymptomatic with regards to chest pain or shortness of breath and wishes to be going home later today.   Discharge Exam: PHYSICAL EXAM: Vitals with BMI 07/28/2020 07/28/2020 07/28/2020  Height - - -  Weight - - -  BMI -  - -  Systolic - 108 92  Diastolic - 61 60  Pulse 62 62 67    CONSTITUTIONAL: Well-developed and well-nourished. No acute distress.  SKIN: Skin is warm and dry. No rash noted. No cyanosis. No pallor. No jaundice HEAD: Normocephalic and atraumatic.  EYES: No scleral icterus MOUTH/THROAT: Moist oral membranes.  NECK: No JVD present. No thyromegaly noted. No carotid bruits  LYMPHATIC: No visible cervical adenopathy.  CHEST Normal respiratory effort. No intercostal retractions  LUNGS: Clear to auscultation bilaterally.  No stridor. No wheezes. No rales.  CARDIOVASCULAR: Regular rate and rhythm, positive S1-S2, soft diastolic murmur heard over the left upper sternal border, no rubs or gallops appreciated. ABDOMINAL: Nonobese, soft, nontender, nondistended, positive bowel sounds in all 4 quadrants no apparent ascites.  EXTREMITIES: No peripheral edema.  Right femoral site healing well.  No hematoma, ecchymosis, or bruits auscultated. HEMATOLOGIC: No significant bruising NEUROLOGIC: Oriented to person, place, and time. Nonfocal. Normal muscle tone.  PSYCHIATRIC: Normal mood and affect. Normal behavior. Cooperative  Recommendations on discharge:   Typical atrial flutter status post radiofrequency ablation: Discontinue Lopressor due to underlying bradycardia. Continue Eliquis 5 mg p.o. twice daily for 4 weeks since radiofrequency ablation that was performed on 07/27/2020. Patient requesting a refill on Eliquis. Discussed with EP during morning rounds. Work restrictions outlined by cardiac electrophysiology team.  Significant Diagnostic Studies:  EKG: 07/26/2020: Atrial flutter with variable AV conduction, rightward axis, poor R wave progression, cannot exclude anteroseptal infarct old.  No evidence of ischemia.  Normal QT interval.  07/28/2020: Low atrial rhythm, 50 bpm.  Atrial flutter radiofrequency ablation: 07/27/2020:   1. Isthmus-dependent right atrial flutter upon presentation.   2.  Successful radiofrequency ablation of atrial flutter along the cavotricuspid isthmus with complete bidirectional  isthmus block achieved.   3. No inducible arrhythmias following ablation.   4. No early apparent complications.   Labs:   Lab Results  Component Value Date   WBC 4.3 07/26/2020   HGB 15.0 07/26/2020   HCT 45.0 07/26/2020   MCV 88.1 07/26/2020   PLT 137 (L) 07/26/2020    Recent Labs  Lab 07/26/20 0843  NA 136  K 3.9  CL 104  CO2 24  BUN 22  CREATININE 0.89  CALCIUM 9.1  GLUCOSE 93    Lipid Panel     Component Value Date/Time   CHOL 198 07/13/2020 0155   TRIG 102 07/13/2020 0155   HDL 50 07/13/2020 0155   CHOLHDL 4.0 07/13/2020 0155   VLDL 20 07/13/2020 0155   LDLCALC 128 (H) 07/13/2020 0155    BNP (last 3 results) Recent Labs    07/09/20 1934 07/13/20 0155  BNP 83.5 42.0    HEMOGLOBIN A1C Lab Results  Component Value Date   HGBA1C 5.8 (H) 07/10/2020   MPG 119.76 07/10/2020    Cardiac Panel (last 3 results) No results for input(s): CKTOTAL, CKMB, TROPONINI, RELINDX in the last 8760 hours.  No results found for: CKTOTAL, CKMB, CKMBINDEX, TROPONINI   TSH Recent Labs    07/09/20 2026  TSH 3.541    Radiology: EP STUDY  Result Date: 07/27/2020  SURGEON:  Loman Brooklyn, MD    PREPROCEDURE DIAGNOSIS:  Atrial flutter.    POSTPROCEDURE DIAGNOSIS:  Isthmus-dependent counter clockwise right atrial flutter.    PROCEDURES:  1. Comprehensive EP study.  2. Coronary sinus pacing and recording.  3. Mapping of supraventricular tachycardia.  4. Radiofrequency ablation of supraventricular tachycardia.    INTRODUCTION: Marco White is a 65 y.o. male with a history of typical appearing atrial flutter who presents today for EP study and radiofrequency ablation.  The patient recently developed symptoms of weakness and fatigue for which he  was found to have atrial flutter with elevated ventricular rates.  The patient remains symptomatic despite rate control.  The  patient has been adequately anticoagulated for three weeks and now presents for EP study and radiofrequency ablation of atrial  flutter.    DESCRIPTION OF PROCEDURE:  Informed written consent was obtained and the  patient was brought to the Electrophysiology Lab in the fasting state. The patient was very clear that she had been compliant with eliquis over the past 3 weeks without interruption.  The patient was adequately sedated with intravenous medication as outlined in the anesthesia report.  The patient's right groin was prepped and draped in the usual sterile fashion by the EP Lab staff.  Using a percutaneous Seldinger technique, two 6-French and one 8-French hemostasis sheaths were placed  into the right common femoral vein.  A Biosense Webster DF catheter was introduced through the right common femoral vein and advanced into the coronary sinus for recording and pacing from this location.    Direct ultrasound guidance is used for right femoral vein with normal vessel patency. Ultrasound images are captured and stored in the patient's chart. Using ultrasound guidance, the Brockenbrough needle and wire were visualized entering the vessel. Presenting Measurements: The patient presented to the Electrophysiology Lab in sinus rhythm.  The PR interval 126 ms, QRS 120 ms, QT 684 ms, RR 1372 ms.  The AH was 67 ms, HV 49 ms. Entrainment and Mapping: Entrainment was performed from the left atrium, which revealed a long postpacing interval.  A Nature conservation officer II XB 8-mm ablation catheter  was introduced through the right common femoral vein and advanced into the right atrium.  The catheter was positioned along the cavotricuspid isthmus.  Entrainment mapping was performed from the cavotricuspid isthmus.  The postpacing interval was equal to the tachycardia cycle length when pacing in this location during entrainment.  The patient was therefore felt to have isthmus-dependent right atrial flutter.  Mapping was  performed along the atrial side of the cavotricuspid isthmus.  This demonstrated a moderate-sized isthmus.  I therefore elected to perform cavotricuspid isthmus ablation today. Ablation: The ablation catheter was therefore positioned along the cavotricuspid isthmus and a series of radiofrequency applications were delivered with a target temperature of 60 degrees of 50 watts for 120 seconds each.  The tachycardia slowed and then terminated during radiofrequency  application.  Additional mapping of the atrial signal was performed with  additional ablation performed.    An 8-  [place] RAMP sheath was therefore advanced through the right common  femoral vein into the right atrium.  The ablation catheter was positioned through the RAMP sheath along the cavotricuspid isthmus and an additional radiofrequency applications were delivered with a target temperature of 60 degrees at 50 watts.  Following bonus radiofrequency applications, complete bidirectional isthmus block was  achieved as evident by differential atrial pacing from the low lateral right atrium.  The patient was observed for 20 minutes without return of conduction through the cavotricuspid isthmus.  Measurements following ablation: Following ablation, the stimulus to earliest atrial activation recorded across the isthmus line measured 160 milliseconds.  The AH interval measured 58 milliseconds with an HV interval of 39 milliseconds.  Atrial pacing was performed, which revealed decremental AV conduction with no evidence of PR greater than RR.  The AV Wenckebach cycle length was 310 milliseconds.  Atrial pacing was continued down to a cycle length of 300 milliseconds with no arrhythmias induced.  Ventricular pacing was performed, which revealed midline decremental VA conduction with a retrograde VA block of 300 milliseconds.  No arrhythmias were induced. The procedure was therefore considered completed.  All catheters were removed and the sheaths were aspirated  and flushed.  The sheaths were removed and hemostasis was assured. EBL<20ml.  There were no early apparent complications.    CONCLUSIONS:  1. Isthmus-dependent right atrial flutter upon presentation.  2. Successful radiofrequency ablation of atrial flutter along the cavotricuspid isthmus with complete bidirectional isthmus block achieved.  3. No inducible arrhythmias following ablation.  4. No early apparent complications.   DG Chest Portable 1 View  Result Date: 07/09/2020 CLINICAL DATA:  Exertional shortness of breath. Dizziness for the past hour. Tachycardia. EXAM: PORTABLE CHEST 1 VIEW COMPARISON:  None. FINDINGS: Mildly enlarged cardiac silhouette. Clear lungs with normal vascularity. Mild lower thoracic spine degenerative change. IMPRESSION: Mild cardiomegaly. No acute abnormality. Electronically Signed   By: Beckie Salts M.D.   On: 07/09/2020 20:07   ECHOCARDIOGRAM COMPLETE  Result Date: 07/10/2020    ECHOCARDIOGRAM REPORT   Patient Name:   Marco White Date of Exam: 07/10/2020 Medical Rec #:  [id number]     Height:       71.0 in Accession #:    [id number]    Weight:       153.9 lb Date of Birth:  1956/03/26     BSA:          1.886 m Patient Age:    64 years      BP:           91/58 mmHg Patient  Gender: M             HR:           70 bpm. Exam Location:  Inpatient Procedure: 2D Echo, Cardiac Doppler and Color Doppler Indications:     I48.92* Unspecified atrial flutter  History:         Patient has no prior history of Echocardiogram examinations.                  Abnormal ECG, Arrythmias:Atrial Flutter;                  Signs/Symptoms:Shortness of Breath and Dyspnea.  Sonographer:     Sheralyn Boatmanina West RDCS Referring Phys:  [id number] Tessa LernerSUNIT Ediel Unangst Diagnosing Phys: Tessa LernerSunit Hansika Leaming DO  Sonographer Comments: Technically difficult study due to poor echo windows and suboptimal apical window. Image acquisition challenging due to respiratory motion. Extremely difficult apical views. IMPRESSIONS  1. Left ventricular ejection  fraction, by estimation, is 50 to 55%. The left ventricle has low normal function. The left ventricle has no regional wall motion abnormalities. Diastolic dysfunction is not assessed due to atrial flutter.  2. Right ventricular systolic function is mildly reduced. The right ventricular size is mildly enlarged. There is normal pulmonary artery systolic pressure.  3. Right atrial size was mildly dilated.  4. The mitral valve is normal in structure. No evidence of mitral valve regurgitation. No evidence of mitral stenosis.  5. The aortic valve is tricuspid. Aortic valve regurgitation is mild to moderate. No aortic stenosis is present.  6. The inferior vena cava is dilated in size with >50% respiratory variability, suggesting right atrial pressure of 8 mmHg. FINDINGS  Left Ventricle: Left ventricular ejection fraction, by estimation, is 50 to 55%. The left ventricle has low normal function. The left ventricle has no regional wall motion abnormalities. The left ventricular internal cavity size was normal in size. There is no left ventricular hypertrophy. Diastolic dysfunction is not assessed due to atrial flutter. Right Ventricle: The right ventricular size is mildly enlarged. No increase in right ventricular wall thickness. Right ventricular systolic function is mildly reduced. There is normal pulmonary artery systolic pressure. The tricuspid regurgitant velocity  is 1.68 m/s, and with an assumed right atrial pressure of 8 mmHg, the estimated right ventricular systolic pressure is 19.3 mmHg. Left Atrium: Left atrial size was normal in size. Right Atrium: Right atrial size was mildly dilated. Pericardium: There is no evidence of pericardial effusion. Mitral Valve: The mitral valve is normal in structure. No evidence of mitral valve regurgitation. No evidence of mitral valve stenosis. Tricuspid Valve: The tricuspid valve is normal in structure. Tricuspid valve regurgitation is trivial. No evidence of tricuspid stenosis.  Aortic Valve: The aortic valve is tricuspid. Aortic valve regurgitation is mild to moderate. Aortic regurgitation PHT measures 606 msec. No aortic stenosis is present. Pulmonic Valve: The pulmonic valve was normal in structure. Pulmonic valve regurgitation is trivial. Aorta: The aortic root and ascending aorta are structurally normal, with no evidence of dilitation. Venous: The inferior vena cava is dilated in size with greater than 50% respiratory variability, suggesting right atrial pressure of 8 mmHg. IAS/Shunts: The atrial septum is grossly normal.  LEFT VENTRICLE PLAX 2D LVIDd:         4.60 cm LVIDs:         3.20 cm LV PW:         1.00 cm LV IVS:        0.90 cm LVOT diam:  2.70 cm LV SV:         74 LV SV Index:   39 LVOT Area:     5.73 cm  LV Volumes (MOD) LV vol d, MOD A4C: 78.5 ml LV vol s, MOD A4C: 26.8 ml LV SV MOD A4C:     78.5 ml RIGHT VENTRICLE            IVC RV S prime:     9.46 cm/s  IVC diam: 2.30 cm TAPSE (M-mode): 3.1 cm LEFT ATRIUM           Index       RIGHT ATRIUM           Index LA diam:      2.50 cm 1.33 cm/m  RA Area:     22.10 cm LA Vol (A4C): 34.1 ml 18.08 ml/m RA Volume:   64.50 ml  34.19 ml/m  AORTIC VALVE LVOT Vmax:   61.50 cm/s LVOT Vmean:  39.900 cm/s LVOT VTI:    0.129 m AI PHT:      606 msec  AORTA Ao Root diam: 3.50 cm MITRAL VALVE                TRICUSPID VALVE MV Area (PHT): 3.85 cm     TR Peak grad:   11.3 mmHg MV Decel Time: 197 msec     TR Vmax:        168.00 cm/s MV E velocity: 105.50 cm/s                             SHUNTS                             Systemic VTI:  0.13 m                             Systemic Diam: 2.70 cm Jarom Govan DO Electronically signed by Tessa Lerner DO Signature Date/Time: 07/10/2020/1:55:48 PM    Final    ECHO TEE  Result Date: 07/12/2020    TRANSESOPHOGEAL ECHO REPORT   Patient Name:   Marco White Date of Exam: 07/12/2020 Medical Rec #:  [id number]     Height:       71.0 in Accession #:    [id number]    Weight:       152.6 lb Date of  Birth:  1955/09/17     BSA:          1.880 m Patient Age:    64 years      BP:           114/72 mmHg Patient Gender: M             HR:           131 bpm. Exam Location:  Inpatient Procedure: Transesophageal Echo, Color Doppler and Cardiac Doppler Indications:     I48.91* Unspeicified atrial fibrillation  History:         Patient has no prior history of Echocardiogram examinations and                  Patient has prior history of Echocardiogram examinations, most                  recent 07/10/2020.  Sonographer:     Roosvelt Maser RDCS Referring Phys:  [id number] Tessa Lerner  Diagnosing Phys: Tessa Lerner DO PROCEDURE: The transesophogeal probe was passed without difficulty through the esophogus of the patient. Imaged were obtained with the patient in a left lateral decubitus position. Local oropharyngeal anesthetic was provided with Cetacaine. Sedation performed by different physician. The patient was monitored while under deep sedation. Anesthestetic sedation was provided intravenously by Anesthesiology: 349mg  of Propofol. Image quality was good. The patient's vital signs; including heart rate, blood pressure, and oxygen saturation; remained stable throughout the procedure. Supplementary images were obtained from transthoracic windows as indicated to answer the clinical question. The patient developed no complications during the procedure. A successful direct current cardioversion was performed at 150J joules with 2 attempts. IMPRESSIONS  1. Left ventricular ejection fraction, by estimation, is 50 to 55%. The left ventricle has low normal function. The left ventricle has no regional wall motion abnormalities. Diastolic function not evaluated due to rapid Atrial Flutter.  2. Right ventricular systolic function is normal. The right ventricular size is normal.  3. 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 The LAA emptying velocity was 43 cm/s.  4. Spontaneous echo contrast present in right atrium.  5. The  mitral valve is normal in structure. Trivial mitral valve regurgitation. No evidence of mitral stenosis.  6. The aortic valve is tricuspid. Aortic valve regurgitation is mild. No aortic stenosis is present. Conclusion(s)/Recommendation(s): No LA/LAA thrombus identified. Successful cardioversion performed with restoration of normal sinus rhythm. FINDINGS  Left Ventricle: Left ventricular ejection fraction, by estimation, is 50 to 55%. The left ventricle has low normal function. The left ventricle has no regional wall motion abnormalities. The left ventricular internal cavity size was normal in size. There is no left ventricular hypertrophy. Diastolic function not evaluated due to rapid Atrial Flutter. Right Ventricle: The right ventricular size is normal. No increase in right ventricular wall thickness. Right ventricular systolic function is normal. Left Atrium: Left atrial size was normal in size. 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 The LAA emptying velocity was 43 cm/s. Right Atrium: Right atrial size was normal in size. Prominent Chiari network and Spontaneous echo contrast present in right atrium. Pericardium: There is no evidence of pericardial effusion. Mitral Valve: The mitral valve is normal in structure. Trivial mitral valve regurgitation. No evidence of mitral valve stenosis. Tricuspid Valve: The tricuspid valve is normal in structure. Tricuspid valve regurgitation is trivial. No evidence of tricuspid stenosis. Aortic Valve: The aortic valve is tricuspid. Aortic valve regurgitation is mild. No aortic stenosis is present. Pulmonic Valve: The pulmonic valve was normal in structure. Pulmonic valve regurgitation is trivial. No evidence of pulmonic stenosis. Aorta: The aortic root and ascending aorta are structurally normal, with no evidence of dilitation. There is minimal (Grade I) plaque involving the descending aorta and transverse aorta. IAS/Shunts: The interatrial septum was not  assessed.   AORTA Ao Root diam: 3.70 cm Ao Asc diam:  2.70 cm Yecenia Dalgleish DO Electronically signed by DO Signature Date/Time: 07/12/2020/6:11:22 PM    Final    FOLLOW UP PLANS AND APPOINTMENTS  Allergies as of 07/28/2020   No Known Allergies     Medication List    STOP taking these medications   metoprolol tartrate 25 MG tablet Commonly known as: LOPRESSOR   NONFORMULARY OR COMPOUNDED ITEM   silver sulfADIAZINE 1 % cream Commonly known as: SILVADENE     TAKE these medications   cholecalciferol 25 MCG (1000 UNIT) tablet Commonly known as: VITAMIN D3 Take 1,000 Units by mouth daily.  Eliquis 5 MG Tabs tablet Generic drug: apixaban Take 1 tablet (5 mg total) by mouth 2 (two) times daily.   finasteride 1 MG tablet Commonly known as: PROPECIA Take 1 mg by mouth daily.       Follow-up Information    Regan Lemming, MD Follow up.   Specialty: Cardiology Why: 08/26/2020 @ 2:45PM Contact information: [address] [place] [address] [phone]        Tessa Lerner, DO Follow up on 09/05/2020.   Specialties: Cardiology, Radiology, Vascular Surgery Why: 1:30pm Contact information: [address] [place] [address] [phone]              Discharge planning: 37 minutes.  Tessa Lerner, [place], [facility]  Pager: [phone] Office: [phone]

## 2020-08-01 ENCOUNTER — Other Ambulatory Visit: Payer: Self-pay | Admitting: Cardiology

## 2020-08-01 DIAGNOSIS — I483 Typical atrial flutter: Secondary | ICD-10-CM

## 2020-08-01 MED ORDER — APIXABAN 5 MG PO TABS: 5.0000 mg | ORAL_TABLET | Freq: Two times a day (BID) | ORAL | 0 refills | Status: DC

## 2020-08-08 ENCOUNTER — Encounter (HOSPITAL_COMMUNITY): Payer: Self-pay | Admitting: Cardiology

## 2020-08-08 NOTE — Addendum Note (Signed)
Addendum  created 08/08/20 1546 by Marcene Duos, MD   Intraprocedure Event edited, Intraprocedure Staff edited

## 2020-08-09 ENCOUNTER — Encounter: Payer: Self-pay | Admitting: Neurology

## 2020-08-09 ENCOUNTER — Ambulatory Visit: Payer: PRIVATE HEALTH INSURANCE | Admitting: Neurology

## 2020-08-09 ENCOUNTER — Ambulatory Visit: Payer: PRIVATE HEALTH INSURANCE | Admitting: Cardiology

## 2020-08-09 VITALS — BP 108/60 | HR 54 | Ht 71.0 in | Wt 158.0 lb

## 2020-08-09 DIAGNOSIS — I4892 Unspecified atrial flutter: Secondary | ICD-10-CM | POA: Diagnosis not present

## 2020-08-09 DIAGNOSIS — R001 Bradycardia, unspecified: Secondary | ICD-10-CM

## 2020-08-09 DIAGNOSIS — Z9889 Other specified postprocedural states: Secondary | ICD-10-CM

## 2020-08-09 DIAGNOSIS — J392 Other diseases of pharynx: Secondary | ICD-10-CM | POA: Diagnosis not present

## 2020-08-09 DIAGNOSIS — R0683 Snoring: Secondary | ICD-10-CM | POA: Diagnosis not present

## 2020-08-09 DIAGNOSIS — Z8679 Personal history of other diseases of the circulatory system: Secondary | ICD-10-CM

## 2020-08-09 NOTE — Progress Notes (Addendum)
Subjective:    Patient ID: Marco White is a 65 y.o. male.  HPI     Marco Foley, MD, PhD [facility] [address], [address] [address] [place], [place] [address]  Dear Dr. Odis Hollingshead,   I saw your patient, Marco White, upon your kind request in my sleep clinic today for initial consultation of his sleep disorder, in particular, concern for underlying obstructive sleep apnea.  The patient is unaccompanied today.  As you know, Dr. Rao is a 65 year old right-handed gentleman, practicing nephrologist at [employer], with an underlying medical history of anemia, thrombocytopenia, hyperlipidemia, and recent diagnosis of atrial flutter, supraventricular tachycardia, s/p cardioversion on 07/12/20, and status post radiofrequency ablation on 07/27/2020, who reports snoring. He was told after his TEE, that his airway was narrow. I reviewed your recent records including hospital notes and discharge summaries.  His Epworth sleepiness score is 1 out of 24, fatigue severity score is 12 out of 63.  He does not have a known family history of sleep apnea but suspects that perhaps his dad had sleep apnea.  Patient denies any night to night nocturia or morning headaches or nocturnal headaches.  He denies any restless legs type symptoms at night.  He has a history of bradycardia.  He used to be a runner.  Weight has been stable, had some weight loss in the recent past but has maintained.  He does not drink caffeine on a day-to-day basis and drinks alcohol rarely, he is a non-smoker.  He has a 30-minute commute approximately.  Bedtime is generally between 1130 and midnight and rise time around 6:30 AM.  He lives with his wife and 34 year old daughter.  Since his ablation his baseline heart rate is between 45 and 55.  His Past Medical History Is Significant For: Past Medical History:  Diagnosis Date  . Anemia     His Past Surgical History Is Significant For: Past Surgical History:   Procedure Laterality Date  . A-FLUTTER ABLATION N/A 07/27/2020   Procedure: A-FLUTTER ABLATION;  Surgeon: Regan Lemming, MD;  [facility]: [facility];  Service: Cardiovascular;  Laterality: N/A;  . CARDIOVERSION N/A 07/12/2020   Procedure: CARDIOVERSION;  Surgeon: Tessa Lerner, DO;  Location: [facility]OS[facility];  Service: Cardiovascular;  Laterality: N/A;  . HERNIA REPAIR    . LUMBAR MICRODISCECTOMY    . MENISCUS REPAIR    . TEE WITHOUT CARDIOVERSION N/A 07/12/2020   Procedure: TRANSESOPHAGEAL ECHOCARDIOGRAM (TEE);  Surgeon: Tessa Lerner, DO;  Location: [facility];  Service: Cardiovascular;  Laterality: N/A;    His Family History Is Significant For: Family History  Problem Relation Age of Onset  . Atrial fibrillation Mother   . Polycystic kidney disease Mother   . Atrial fibrillation Father   . Prostate cancer Father     His Social History Is Significant For: Social History   Socioeconomic History  . Marital status: Married    Spouse name: Not on file  . Number of children: Not on file  . Years of education: Not on file  . Highest education level: Not on file  Occupational History  . Not on file  Tobacco Use  . Smoking status: Never Smoker  . Smokeless tobacco: Never Used  Vaping Use  . Vaping Use: Never used  Substance and Sexual Activity  . Alcohol use: Yes    Comment: Occasional  . Drug use: Never  . Sexual activity: Not on file  Other Topics Concern  . Not on file  Social History Narrative  . Not on file   Social Determinants of Health   Financial Resource Strain: Not on file  Food Insecurity: Not on file  Transportation Needs: Not on file  Physical Activity: Not on file  Stress: Not on file  Social Connections: Not on file    His Allergies Are:  No Known Allergies:   His Current Medications Are:  Outpatient Encounter Medications as of 08/09/2020  Medication Sig  . apixaban (ELIQUIS) 5 MG TABS tablet Take 1 tablet (5 mg total) by mouth 2 (two)  times daily.  . cholecalciferol (VITAMIN D3) 25 MCG (1000 UNIT) tablet Take 1,000 Units by mouth daily.  . finasteride (PROPECIA) 1 MG tablet Take 1 mg by mouth daily.   No facility-administered encounter medications on file as of 08/09/2020.  :   Review of Systems:  Out of a complete 14 point review of systems, all are reviewed and negative with the exception of these symptoms as listed below:  Review of Systems  Neurological:       Here for sleep consult. No prior sleep study. Epworth Sleepiness Scale 0= would never doze 1= slight chance of dozing 2= moderate chance of dozing 3= high chance of dozing  Sitting and reading:0 Watching TV:1 Sitting inactive in a public place (ex. Theater or meeting):0 As a passenger in a car for an hour without a break:0 Lying down to rest in the afternoon:0 Sitting and talking to someone:0 Sitting quietly after lunch (no alcohol):0 In a car, while stopped in traffic:0 Total:1     Objective:  Neurological Exam  Physical Exam Physical Examination:   Vitals:   08/09/20 1521  BP: 108/60  Pulse: (!) 54    General Examination: The patient is a very pleasant 65 y.o. male in no acute distress. He appears well-developed and well-nourished and well groomed.   HEENT: Normocephalic, atraumatic, pupils are equal, round and reactive to light, extraocular tracking is good without limitation to gaze excursion or nystagmus noted. Hearing is grossly intact. Face is symmetric with normal facial animation. Speech is clear with no dysarthria noted. There is no hypophonia. There is no lip, neck/head, jaw or voice tremor. Neck is supple with full range of passive and active motion. There are no carotid bruits on auscultation. Oropharynx exam reveals: mild mouth dryness, good dental hygiene and mild airway crowding, due to smaller airway entry and slightly prominent uvula. Mallampati is class II. Tongue protrudes centrally and palate elevates symmetrically.  Tonsils are small, ?absent. Neck  1+/2+ in size/absent. Neck size is 15 inches.  He has a mild overbite.  Chest: Clear to auscultation without wheezing, rhonchi or crackles noted.  Heart: S1+S2+0, regular and normal without murmurs, rubs or gallops noted.   Abdomen: Soft, non-tender and non-distended.  Extremities: There is no obvious edema in the distal lower extremities bilaterally.   Skin: Warm and dry without trophic changes noted.   Musculoskeletal: exam reveals no obvious joint deformities, tenderness or joint swelling.   Neurologically:  Mental status: The patient is awake, alert and oriented in all 4 spheres. His immediate and remote memory, attention, language skills and fund of knowledge are appropriate. There is no evidence of aphasia, agnosia, apraxia or anomia. Speech is clear with normal prosody and enunciation. Thought process is linear. Mood is normal and affect is normal.  Cranial nerves II - XII are as described above under HEENT exam.  Motor exam: No obvious abnormality, no obvious tremor, gait, posture and balance are unremarkable.  Sensory exam intact to light touch.  No problems with dysmetria or coordination.   Assessment and Plan:  In summary, Audie Stayer is a very pleasant 65 y.o.-year old male with an underlying medical history of anemia, thrombocytopenia, hyperlipidemia, and recent diagnosis of atrial flutter, supraventricular tachycardia, s/p cardioversion on 07/12/20, and status post radiofrequency ablation on 07/27/2020, whose history and physical exam are concerning for obstructive sleep apnea (OSA). I discussed the sleep apnea diagnosis with the patient.  He is well aware of the implications of untreated sleep apnea.  He would be willing to try a CPAP or AutoPap machine if the need arises, alternative treatment options were discussed including a dental device as well as surgical options such as inspire.  We will reconvene after testing and we will call him in the  interim with his test results.  I answered all his questions today and the patient was in agreement. I plan to see him back after the sleep study is completed and encouraged him to call with any interim questions, concerns, problems or updates.   Thank you very much for allowing me to participate in the care of this nice patient. If I can be of any further assistance to you please do not hesitate to call me at [phone].  Sincerely,   Marco Foley, MD, PhD

## 2020-08-09 NOTE — Patient Instructions (Signed)
Verbal AVS provided.

## 2020-08-16 NOTE — Telephone Encounter (Signed)
Left message to call back  

## 2020-08-17 DIAGNOSIS — I483 Typical atrial flutter: Secondary | ICD-10-CM

## 2020-08-17 DIAGNOSIS — I480 Paroxysmal atrial fibrillation: Secondary | ICD-10-CM

## 2020-08-17 DIAGNOSIS — R002 Palpitations: Secondary | ICD-10-CM

## 2020-08-18 NOTE — Telephone Encounter (Signed)
done

## 2020-08-18 NOTE — Telephone Encounter (Signed)
ICD-10-CM   1. Typical atrial flutter (HCC)  I48.3 LONG TERM MONITOR (3-14 DAYS)  2. Palpitations  R00.2 LONG TERM MONITOR (3-14 DAYS)  3. Paroxysmal atrial fibrillation (HCC)  I48.0 LONG TERM MONITOR (3-14 DAYS)   Patient having in and out episodes of what appears to be atrial fibrillation with controlled ventricular response, some of the rhythm strips appear to show a flutter, will perform extended EKG monitoring for 2 weeks to evaluate for atrial fibrillation and atrial fibrillation burden.   Yates Decamp, MD, [facility] 08/18/2020, 8:40 AM Office: [phone] Fax: [phone] Pager: [phone]

## 2020-08-18 NOTE — Telephone Encounter (Signed)
Looks like he is in and out of A. Fib. I am performing Zio for confirming A. Fib and burden and will keep you informed. JG

## 2020-08-18 NOTE — Telephone Encounter (Signed)
Patient coming in for Zio for 2 weeks tomorrow at 8 am. Not live

## 2020-08-19 ENCOUNTER — Inpatient Hospital Stay: Payer: PRIVATE HEALTH INSURANCE

## 2020-08-19 ENCOUNTER — Other Ambulatory Visit: Payer: Self-pay

## 2020-08-19 DIAGNOSIS — R002 Palpitations: Secondary | ICD-10-CM

## 2020-08-19 DIAGNOSIS — I483 Typical atrial flutter: Secondary | ICD-10-CM

## 2020-08-19 DIAGNOSIS — I480 Paroxysmal atrial fibrillation: Secondary | ICD-10-CM

## 2020-08-22 ENCOUNTER — Telehealth: Payer: Self-pay

## 2020-08-22 NOTE — Telephone Encounter (Signed)
LVM for pt to call me back to schedule sleep study  

## 2020-08-26 ENCOUNTER — Ambulatory Visit: Payer: PRIVATE HEALTH INSURANCE | Admitting: Cardiology

## 2020-08-30 ENCOUNTER — Ambulatory Visit: Payer: PRIVATE HEALTH INSURANCE | Admitting: Cardiology

## 2020-09-05 ENCOUNTER — Ambulatory Visit: Payer: PRIVATE HEALTH INSURANCE | Admitting: Cardiology

## 2020-09-11 ENCOUNTER — Other Ambulatory Visit: Payer: Self-pay

## 2020-09-11 DIAGNOSIS — I483 Typical atrial flutter: Secondary | ICD-10-CM

## 2020-09-11 NOTE — Progress Notes (Signed)
Jule Ser Patch Extended out patient EKG monitoring 14 days starting 08/19/2020: Patient had a min HR of 30 bpm, max HR of 214 bpm, and avg HR of 62 bpm. Predominant underlying rhythm was Sinus Rhythm.  5 Ventricular Tachycardia runs occurred, fastest lasting 6 beats with a max rate of 214 bpm, the longest lasting 13 beats with an avg rate of 105 bpm.  Atrial Fibrillation occurred (8% burden), ranging from 54-200 bpm (avg of 94 bpm), the longest lasting 9 hours 41 mins with an avg rate of 99 bpm.  Ventricular Tachycardia and Atrial Fibrillation were detected  within +/- 45 seconds of symptomatic patient event(s).  There were rare PVCs and PACs.  No heart block.

## 2020-09-12 ENCOUNTER — Ambulatory Visit: Payer: PRIVATE HEALTH INSURANCE | Admitting: Cardiology

## 2020-09-12 ENCOUNTER — Encounter: Payer: Self-pay | Admitting: Cardiology

## 2020-09-12 ENCOUNTER — Other Ambulatory Visit: Payer: Self-pay

## 2020-09-12 VITALS — BP 118/68 | HR 60 | Ht 71.0 in | Wt 158.0 lb

## 2020-09-12 DIAGNOSIS — I483 Typical atrial flutter: Secondary | ICD-10-CM | POA: Diagnosis not present

## 2020-09-12 DIAGNOSIS — I480 Paroxysmal atrial fibrillation: Secondary | ICD-10-CM

## 2020-09-12 MED ORDER — APIXABAN 5 MG PO TABS: 5.0000 mg | ORAL_TABLET | Freq: Two times a day (BID) | ORAL | 0 refills | Status: DC

## 2020-09-12 NOTE — Progress Notes (Signed)
Electrophysiology Office Note   Date:  09/12/2020   ID:  Marco White, DOB 07-Jun-1955, MRN [id number]  PCP:  Marco Going, MD  Cardiologist:  Marco White Primary Electrophysiologist:  Marco Lemming, MD    Chief Complaint: atrial flutter   History of Present Illness: Marco White is a 65 y.o. male who is being seen today for the evaluation of atrial flutter at the request of Marco Going, MD. Presenting today for electrophysiology evaluation.  He has a history significant for atrial flutter.  He presented to the hospital 07/26/2020 in atrial flutter.  He is now status post atrial flutter ablation on 07/27/2020.  He continued to have palpitations after his ablation and was found to have atrial fibrillation.  Today, he denies symptoms of palpitations, chest pain, shortness of breath, orthopnea, PND, lower extremity edema, claudication, dizziness, presyncope, syncope, bleeding, or neurologic sequela. The patient is tolerating medications without difficulties.  He is very aware of his atrial fibrillation.  He has fatigue weakness and palpitations.  He also has some mild shortness of breath.  At this point, he would prefer to avoid antiarrhythmic medications and would prefer ablation.   Past Medical History:  Diagnosis Date   Anemia    Past Surgical History:  Procedure Laterality Date   A-FLUTTER ABLATION N/A 07/27/2020   Procedure: A-FLUTTER ABLATION;  Surgeon: Marco Lemming, MD;  [facility];  Service: Cardiovascular;  Laterality: N/A;   CARDIOVERSION N/A 07/12/2020   Procedure: CARDIOVERSION;  Surgeon: Marco Lerner, DO;  Location: [facility];  Service: Cardiovascular;  Laterality: N/A;   HERNIA REPAIR     LUMBAR MICRODISCECTOMY     MENISCUS REPAIR     TEE WITHOUT CARDIOVERSION N/A 07/12/2020   Procedure: TRANSESOPHAGEAL ECHOCARDIOGRAM (TEE);  Surgeon: Marco Lerner, DO;  Location: [facility];  Service: Cardiovascular;  Laterality: N/A;     Current  Outpatient Medications  Medication Sig Dispense Refill   apixaban (ELIQUIS) 5 MG TABS tablet Take 1 tablet (5 mg total) by mouth 2 (two) times daily. 60 tablet 0   cholecalciferol (VITAMIN D3) 25 MCG (1000 UNIT) tablet Take 1,000 Units by mouth daily.     finasteride (PROPECIA) 1 MG tablet Take 1 mg by mouth daily.     No current facility-administered medications for this visit.    Allergies:   Patient has no allergy information on record.   Social History:  The patient  reports that he has never smoked. He has never used smokeless tobacco. He reports current alcohol use. He reports that he does not use drugs.   Family History:  The patient's family history includes Atrial fibrillation in his father and mother; Polycystic kidney disease in his mother; Prostate cancer in his father.    ROS:  Please see the history of present illness.   Otherwise, review of systems is positive for none.   All other systems are reviewed and negative.    PHYSICAL EXAM: VS:  BP 118/68   Pulse 60   Ht 5\' 11"  (1.803 m)   Wt 158 lb (71.7 kg)   BMI 22.04 kg/m  , BMI Body mass index is 22.04 kg/m. GEN: Well nourished, well developed, in no acute distress  HEENT: normal  Neck: no JVD, carotid bruits, or masses Cardiac: RRR; no murmurs, rubs, or gallops,no edema  Respiratory:  clear to auscultation bilaterally, normal work of breathing GI: soft, nontender, nondistended, + BS MS: no deformity or atrophy  Skin: warm and dry Neuro:  Strength and  sensation are intact Psych: euthymic mood, full affect  EKG:  EKG is ordered today. Personal review of the ekg ordered shows sinus rhythm, rate 60 atrial fibrillation.  Follow-up with  Recent Labs: 07/09/2020: TSH 3.541 07/13/2020: B Natriuretic Peptide 42.0; Magnesium 2.0 07/26/2020: BUN 22; Creatinine, Ser 0.89; Hemoglobin 15.0; Platelets 137; Potassium 3.9; Sodium 136    Lipid Panel     Component Value Date/Time   CHOL 198 07/13/2020 0155   TRIG 102  07/13/2020 0155   HDL 50 07/13/2020 0155   CHOLHDL 4.0 07/13/2020 0155   VLDL 20 07/13/2020 0155   LDLCALC 128 (H) 07/13/2020 0155   LDLDIRECT 126.2 (H) 07/13/2020 0155     Wt Readings from Last 3 Encounters:  09/12/20 158 lb (71.7 kg)  08/09/20 158 lb (71.7 kg)  07/27/20 149 lb (67.6 kg)      Other studies Reviewed: Additional studies/ records that were reviewed today include: Cardiac monitor 09/11/2020 personally reviewed Review of the above records today demonstrates:  Patient had a min HR of 30 bpm, max HR of 214 bpm, and avg HR of 62 bpm. Predominant underlying rhythm was Sinus Rhythm. 5 Ventricular Tachycardia runs occurred, fastest lasting 6 beats with a max rate of 214 bpm, the longest lasting 13 beats with an avg rate of 105 bpm. Atrial Fibrillation occurred (8% burden), ranging from 54-200 bpm (avg of 94 bpm), the longest lasting 9 hours 41 mins with an avg rate of 99 bpm. Ventricular Tachycardia and Atrial Fibrillation were detected  within +/- 45 seconds of symptomatic patient event(s). There were rare PVCs and PACs.  No heart block.  Correct   ASSESSMENT AND PLAN:  1.  Typical atrial flutter: Status post ablation 07/27/2020.  He has had no further episodes of atrial flutter based on most recent monitoring.  2.  Paroxysmal atrial fibrillation: Found on most recent monitor.  He had an 8% burden.  Currently on Eliquis.  CHA2DS2-VASc of 0.  We discussed further management with either ablation versus medications.  He would like to avoid antiarrhythmics and would prefer ablation.  Risk, benefits, and alternatives to EP study and radiofrequency ablation for afib were also discussed in detail today. These risks include but are not limited to stroke, bleeding, vascular damage, tamponade, perforation, damage to the esophagus, lungs, and other structures, pulmonary vein stenosis, worsening renal function, and death. The patient understands these risk and wishes to proceed.  We Marco White  therefore proceed with catheter ablation at the next available time.  Carto, ICE, anesthesia are requested for the procedure.  Marco White also obtain CT PV protocol prior to the procedure to exclude LAA thrombus and further evaluate atrial anatomy.  Case discussed with primary cardiologist   Current medicines are reviewed at length with the patient today.   The patient does not have concerns regarding his medicines.  The following changes were made today:  none  Labs/ tests ordered today include:  Orders Placed This Encounter  Procedures   CT CARDIAC MORPH/PULM VEIN W/CM&W/O CA SCORE   EKG 12-Lead     Disposition:   FU with Shaquanna Lycan 3 months  Signed, Monta Maiorana Jorja Loa, MD  09/12/2020 2:45 PM     [facility] [address] [place] [address] [phone] (office) [phone] (fax)

## 2020-09-12 NOTE — Patient Instructions (Addendum)
Medication Instructions:  Your physician recommends that you continue on your current medications as directed. Please refer to the Current Medication list given to you today.  *If you need a refill on your cardiac medications before your next appointment, please call your pharmacy*   Lab Work: Pre procedure labs on 7/18 when you see your primary doctor:  BMP & CBC  If you have labs (blood work) drawn today and your tests are completely normal, you will receive your results only by: MyChart Message (if you have MyChart) OR A paper copy in the mail If you have any lab test that is abnormal or we need to change your treatment, we will call you to review the results.   Testing/Procedures: Your physician has requested that you have cardiac CT within 7 days PRIOR to your ablation. Cardiac computed tomography (CT) is a painless test that uses an x-ray machine to take clear, detailed pictures of your heart.  Please follow instruction below located under "other instructions". You will get a call from our office to schedule the date for this test.  Your physician has recommended that you have an ablation. Catheter ablation is a medical procedure used to treat some cardiac arrhythmias (irregular heartbeats). During catheter ablation, a long, thin, flexible tube is put into a blood vessel in your groin (upper thigh), or neck. This tube is called an ablation catheter. It is then guided to your heart through the blood vessel. Radio frequency waves destroy small areas of heart tissue where abnormal heartbeats may cause an arrhythmia to start. Please follow instruction below located under "other instructions".   Follow-Up: At [facility], you and your health needs are our priority.  As part of our continuing mission to provide you with exceptional heart care, we have created designated Provider Care Teams.  These Care Teams include your primary Cardiologist (physician) and Advanced Practice Providers (APPs  -  Physician Assistants and Nurse Practitioners) who all work together to provide you with the care you need, when you need it.  We recommend signing up for the patient portal called "MyChart".  Sign up information is provided on this After Visit Summary.  MyChart is used to connect with patients for Virtual Visits (Telemedicine).  Patients are able to view lab/test results, encounter notes, upcoming appointments, etc.  Non-urgent messages can be sent to your provider as well.   To learn more about what you can do with MyChart, go to [email or web address].    Your next appointment:   1 month(s) after your ablation  The format for your next appointment:   In Person  Provider:   AFib clinic   Thank you for choosing [facility]!!   Dory Horn, RN [phone]    Other Instructions   CT INSTRUCTIONS Your cardiac CT will be scheduled at:  [facility] [address] [place], [place] [address] [phone]  Please arrive at the [facility] main entrance (entrance A) of [facility] 30 minutes prior to test start time. Proceed to the [facility] Radiology Department (first floor) to check-in and test prep.   Please follow these instructions carefully (unless otherwise directed):  Hold all erectile dysfunction medications at least 3 days (72 hrs) prior to test.  On the Night Before the Test: Be sure to Drink plenty of water. Do not consume any caffeinated/decaffeinated beverages or chocolate 12 hours prior to your test. Do not take any antihistamines 12 hours prior to your test.  On the Day of  the Test: Drink plenty of water until 1 hour prior to the test. Do not eat any food 4 hours prior to the test. You may take your regular medications prior to the test.  HOLD Furosemide/Hydrochlorothiazide morning of the test.       After the Test: Drink plenty of water. After receiving IV contrast, you may experience a mild flushed feeling. This is  normal. On occasion, you may experience a mild rash up to 24 hours after the test. This is not dangerous. If this occurs, you can take Benadryl 25 mg and increase your fluid intake. If you experience trouble breathing, this can be serious. If it is severe call 911 IMMEDIATELY. If it is mild, please call our office. If you take any of these medications: Glipizide/Metformin, Avandament, Glucavance, please do not take 48 hours after completing test unless otherwise instructed.   Once we have confirmed authorization from your insurance company, we will call you to set up a date and time for your test. Based on how quickly your insurance processes prior authorizations requests, please allow up to 4 weeks to be contacted for scheduling your Cardiac CT appointment. Be advised that routine Cardiac CT appointments could be scheduled as many as 8 weeks after your provider has ordered it.  For non-scheduling related questions, please contact the cardiac imaging nurse navigator should you have any questions/concerns: Rockwell AlexandriaSara Wallace, Cardiac Imaging Nurse Navigator Larey BrickMerle Prescott, Cardiac Imaging Nurse Navigator [facility] Direct Office Dial: [phone]   For scheduling needs, including cancellations and rescheduling, please call [place], [phone].     Electrophysiology/Ablation Procedure Instructions   You are scheduled for a(n)  ablation on 11/04/2020 with Dr. Loman BrooklynWill Camnitz.   1.   Pre procedure testing-             A.  LAB WORK --- On 10/17/20 at PCP office, for your pre procedure blood work.     On the day of your procedure 11/04/2020 [facility] [phone]([address]) at 8:30 am.  Bonita QuinYou will go to the main entrance A [employer] and enter where the [employer] parking staff are.  Your driver will drop you off and you will head down the hallway to ADMITTING.  You may have one support person come in to the hospital with you.  They will be asked to wait in the  waiting room. It is OK to have someone drop you off and come back when you are ready to be discharged.   3.   Do not eat or drink after midnight prior to your procedure.   4.   On the morning of your procedure do NOT take any medication. Do not miss any doses of your blood thinner prior to the morning of your procedure or your procedure will need to be rescheduled.   5.  Plan for an overnight stay but you may be discharged after your procedure, if you use your phone frequently bring your phone charger. If you are discharged after your procedure you will need someone to drive you home and be with you for 24 hours after your procedure.   6. You will follow up with the AFIB clinic 4 weeks after your procedure.  You will follow up with Dr. Elberta Fortisamnitz  3 months after your procedure.  These appointments will be made for you.   * If you have ANY questions please call the office [phone] and ask for [facility] RN or  send me a MyChart message   * Occasionally, EP Studies and ablations can become lengthy.  Please make your family aware of this before your procedure starts.  Average time ranges from 2-8 hours for EP studies/ablations.  Your physician will call your family after the procedure with the results.                                     Cardiac Ablation Cardiac ablation is a procedure to destroy (ablate) some heart tissue that is sending bad signals. These bad signals causeproblems in heart rhythm. The heart has many areas that make these signals. If there are problems in these areas, they can make the heart beat in a way that is not normal.Destroying some tissues can help make the heart rhythm normal. Tell your doctor about: Any allergies you have. All medicines you are taking. These include vitamins, herbs, eye drops, creams, and over-the-counter medicines. Any problems you or family members have had with medicines that make you fall asleep (anesthetics). Any blood disorders you have. Any  surgeries you have had. Any medical conditions you have, such as kidney failure. Whether you are pregnant or may be pregnant. What are the risks? This is a safe procedure. But problems may occur, including: Infection. Bruising and bleeding. Bleeding into the chest. Stroke or blood clots. Damage to nearby areas of your body. Allergies to medicines or dyes. The need for a pacemaker if the normal system is damaged. Failure of the procedure to treat the problem. What happens before the procedure? Medicines Ask your doctor about: Changing or stopping your normal medicines. This is important. Taking aspirin and ibuprofen. Do not take these medicines unless your doctor tells you to take them. Taking other medicines, vitamins, herbs, and supplements. General instructions Follow instructions from your doctor about what you cannot eat or drink. Plan to have someone take you home from the hospital or clinic. If you will be going home right after the procedure, plan to have someone with you for 24 hours. Ask your doctor what steps will be taken to prevent infection. What happens during the procedure?  An IV tube will be put into one of your veins. You will be given a medicine to help you relax. The skin on your neck or groin will be numbed. A cut (incision) will be made in your neck or groin. A needle will be put through your cut and into a large vein. A tube (catheter) will be put into the needle. The tube will be moved to your heart. Dye may be put through the tube. This helps your doctor see your heart. Small devices (electrodes) on the tube will send out signals. A type of energy will be used to destroy some heart tissue. The tube will be taken out. Pressure will be held on your cut. This helps stop bleeding. A bandage will be put over your cut. The exact procedure may vary among doctors and hospitals. What happens after the procedure? You will be watched until you leave the hospital  or clinic. This includes checking your heart rate, breathing rate, oxygen, and blood pressure. Your cut will be watched for bleeding. You will need to lie still for a few hours. Do not drive for 24 hours or as long as your doctor tells you. Summary Cardiac ablation is a procedure to destroy some heart tissue. This is done to treat heart rhythm problems.  Tell your doctor about any medical conditions you may have. Tell him or her about all medicines you are taking to treat them. This is a safe procedure. But problems may occur. These include infection, bruising, bleeding, and damage to nearby areas of your body. Follow what your doctor tells you about food and drink. You may also be told to change or stop some of your medicines. After the procedure, do not drive for 24 hours or as long as your doctor tells you. This information is not intended to replace advice given to you by your health care provider. Make sure you discuss any questions you have with your healthcare provider. Document Revised: 02/19/2019 Document Reviewed: 02/19/2019 Elsevier Patient Education  2022 [employer].

## 2020-10-04 ENCOUNTER — Ambulatory Visit: Payer: PRIVATE HEALTH INSURANCE | Admitting: Cardiology

## 2020-10-05 ENCOUNTER — Ambulatory Visit (INDEPENDENT_AMBULATORY_CARE_PROVIDER_SITE_OTHER): Payer: No Typology Code available for payment source | Admitting: Neurology

## 2020-10-05 ENCOUNTER — Other Ambulatory Visit: Payer: Self-pay

## 2020-10-05 DIAGNOSIS — J392 Other diseases of pharynx: Secondary | ICD-10-CM

## 2020-10-05 DIAGNOSIS — R0683 Snoring: Secondary | ICD-10-CM

## 2020-10-05 DIAGNOSIS — Z8679 Personal history of other diseases of the circulatory system: Secondary | ICD-10-CM

## 2020-10-05 DIAGNOSIS — G4733 Obstructive sleep apnea (adult) (pediatric): Secondary | ICD-10-CM

## 2020-10-05 DIAGNOSIS — R9431 Abnormal electrocardiogram [ECG] [EKG]: Secondary | ICD-10-CM

## 2020-10-05 DIAGNOSIS — G4720 Circadian rhythm sleep disorder, unspecified type: Secondary | ICD-10-CM

## 2020-10-05 DIAGNOSIS — I4892 Unspecified atrial flutter: Secondary | ICD-10-CM

## 2020-10-05 DIAGNOSIS — R001 Bradycardia, unspecified: Secondary | ICD-10-CM

## 2020-10-06 NOTE — Procedures (Signed)
PATIENT'S NAME:  Marco White, Marco White DOB:      09/23/1955      MR#:    [id number]     DATE OF RECORDING: 10/05/2020 REFERRING M.D.:  Tessa Lerner, DO Study Performed:   Baseline Polysomnogram HISTORY: 65 year old man with a history of anemia, thrombocytopenia, hyperlipidemia, and recent diagnosis of atrial flutter, supraventricular tachycardia, s/p cardioversion on 07/12/20, and status post radiofrequency ablation on 07/27/2020, with recurrence of atrial fibrillation, who reports snoring. He has a history of bradycardia. The patient endorsed the Epworth Sleepiness Scale at 1 points. The patient's weight 158 pounds with a height of 71 (inches), resulting in a BMI of 22.2 kg/m2. The patient's neck circumference measured 15 inches.  CURRENT MEDICATIONS: Eliquis, Vitamin D3, Propecia   PROCEDURE:  This is a multichannel digital polysomnogram utilizing the Somnostar 11.2 system.  Electrodes and sensors were applied and monitored per AASM Specifications.   EEG, EOG, Chin and Limb EMG, were sampled at 200 Hz.  ECG, Snore and Nasal Pressure, Thermal Airflow, Respiratory Effort, CPAP Flow and Pressure, Oximetry was sampled at 50 Hz. Digital video and audio were recorded.      BASELINE STUDY  Lights Out was at 22:45 and Lights On at 05:10.  Total recording time (TRT) was 386 minutes, with a total sleep time (TST) of 362.5 minutes.   The patient's sleep latency was 25.5 minutes.  REM latency was 54 minutes, which is mildly reduced. The sleep efficiency was 93.9 %.     SLEEP ARCHITECTURE: WASO (Wake after sleep onset) was 19.5 minutes with minimal to mild sleep fragmentation noted. There were 15 minutes in Stage N1, 254 minutes Stage N2, 22.5 minutes Stage N3 and 71 minutes in Stage REM.  The percentage of Stage N1 was 4.1%, Stage N2 was 70.1%, which is increased, Stage N3 was 6.2% and Stage R (REM sleep) was 19.6%, which is normal (near-normal). The arousals were noted as: 34 were spontaneous, 0 were associated with PLMs,  58 were associated with respiratory events.  RESPIRATORY ANALYSIS:  There were a total of 111 respiratory events:  72 obstructive apneas, 10 central apneas and 4 mixed apneas with a total of 86 apneas and an apnea index (AI) of 14.2 /hour. There were 25 hypopneas with a hypopnea index of 4.1 /hour. The patient also had 0 respiratory event related arousals (RERAs).      The total APNEA/HYPOPNEA INDEX (AHI) was 18.4/hour and the total RESPIRATORY DISTURBANCE INDEX was  18.4 /hour.  26 events occurred in REM sleep and 56 events in NREM. The REM AHI was  22. /hour, versus a non-REM AHI of 17.5. The patient spent 237.5 minutes of total sleep time in the supine position and 125 minutes in non-supine.. The supine AHI was 26.8 versus a non-supine AHI of 2.4.  OXYGEN SATURATION & C02:  The Wake baseline 02 saturation was 96%, with the lowest being 79%. Time spent below 89% saturation equaled 9 minutes.  PERIODIC LIMB MOVEMENTS:  The patient had a total of 0 Periodic Limb Movements.  The Periodic Limb Movement (PLM) index was 0 and the PLM Arousal index was 0/hour.  Audio and video analysis did not show any abnormal or unusual movements, behaviors, phonations or vocalizations. The patient took no bathroom breaks. Intermittent mild to moderate snoring was noted. The study showed a fairly irregular single lead EKG throughout the night with evidence of atrial fibrillation. He had bradycardia with heart rate fluctuating between the higher 30s to lower 40s for most of the  night.  Post-study, the patient indicated that sleep was the same as usual.   IMPRESSION: Obstructive Sleep Apnea (OSA) Dysfunctions associated with sleep stages or arousal from sleep Non-specific abnormal EKG, bradycardia  RECOMMENDATIONS: This study demonstrates moderate obstructive sleep apnea, with a total AHI of 18.4/hour, REM AHI of 22/hour, supine AHI of 26.8/hour and O2 nadir of 79% (during supine REM sleep). Treatment with positive  airway pressure in the form of CPAP is recommended. This will - ideally - require a full night titration study to optimize therapy; alternatively, an autoPAP trial/titration can be considered, if preferred by the patient. Other treatment options may include a dental device or surgical options.    Please note that untreated obstructive sleep apnea may carry additional perioperative morbidity. Patients with significant obstructive sleep apnea should receive perioperative PAP therapy and the surgeons and particularly the anesthesiologist should be informed of the diagnosis and the severity of the sleep disordered breathing. The study showed a fairly irregular single lead EKG throughout the night with evidence of atrial fibrillation (at times). He had bradycardia with heart rate fluctuating between the higher 30s to lower 40s for most of the night. Clinical correlation is recommended; the patient is closely followed by cardiology. This study shows mild sleep fragmentation and mildly abnormal sleep stage percentages; these are nonspecific findings and per se do not signify an intrinsic sleep disorder or a cause for the patient's sleep-related symptoms. Causes include (but are not limited to) the first night effect of the sleep study, circadian rhythm disturbances, medication effect or an underlying mood disorder or medical problem.  The patient should be cautioned not to drive, work at heights, or operate dangerous or heavy equipment when tired or sleepy. Review and reiteration of good sleep hygiene measures should be pursued with any patient. The patient will be seen in follow-up in the sleep clinic at [facility] for discussion of the test results, symptom and treatment compliance review, further management strategies, etc. The referring provider will be notified of the test results.   I certify that I have reviewed the entire raw data recording prior to the issuance of this report in accordance with the Standards of  Accreditation of the American Academy of Sleep Medicine (AASM)  Huston Foley, MD, PhD Diplomat, American Board of Neurology and Sleep Medicine (Neurology and Sleep Medicine)

## 2020-10-07 ENCOUNTER — Encounter: Payer: Self-pay | Admitting: Neurology

## 2020-10-10 NOTE — Telephone Encounter (Signed)
LMVm for pt mobile to return call.

## 2020-10-10 NOTE — Telephone Encounter (Signed)
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 Odis Hollingshead for evaluation of OSA. He had a diagnostic PSG on 10/05/20.   Please call and notify the patient that the recent sleep study showed moderate obstructive sleep apnea. I recommend treatment for this in the form of CPAP. This will require, ideally, a repeat sleep study for proper titration and mask fitting and correct monitoring of the oxygen saturations. If he prefers, we can start him on autoPAP at home, if he does not prefer to come back in for a formal titration study. Please let me know, how he would like to proceed (start autoPAP vs. coming in for a titration study). Thanks.  Huston Foley, MD, PhD [facility])

## 2020-10-24 ENCOUNTER — Telehealth (HOSPITAL_COMMUNITY): Payer: Self-pay | Admitting: *Deleted

## 2020-10-24 NOTE — Telephone Encounter (Signed)
Attempted to call patient regarding upcoming cardiac CT appointment. °Left message on voicemail with name and callback number ° °Bea Duren RN Navigator Cardiac Imaging °[facility] °[phone] Office °[phone] Cell ° °

## 2020-10-25 ENCOUNTER — Telehealth (HOSPITAL_COMMUNITY): Payer: Self-pay | Admitting: *Deleted

## 2020-10-25 NOTE — Telephone Encounter (Signed)
Patient returning call regarding upcoming cardiac imaging study; pt verbalizes understanding of appt date/time, parking situation and where to check in, pre-test NPO status, and verified current allergies; name and call back number provided for further questions should they arise  Darroll Bredeson RN Navigator Cardiac Imaging [facility] [phone] office [phone] cell  

## 2020-10-26 ENCOUNTER — Ambulatory Visit (HOSPITAL_COMMUNITY)
Admission: RE | Admit: 2020-10-26 | Discharge: 2020-10-26 | Disposition: A | Discharge status: Home / Self Care | Payer: No Typology Code available for payment source | Source: Ambulatory Visit | Attending: Cardiology | Admitting: Cardiology

## 2020-10-26 ENCOUNTER — Other Ambulatory Visit: Payer: Self-pay

## 2020-10-26 DIAGNOSIS — I480 Paroxysmal atrial fibrillation: Secondary | ICD-10-CM

## 2020-10-26 MED ORDER — IOHEXOL 350 MG/ML SOLN
80.0000 mL | Freq: Once | INTRAVENOUS | Status: AC | PRN
  Administered 2020-10-26: 80 mL via INTRAVENOUS

## 2020-11-03 NOTE — Anesthesia Preprocedure Evaluation (Addendum)
Anesthesia Evaluation  Patient identified by MRN, date of birth, ID band Patient awake    Reviewed: Allergy & Precautions, NPO status , Patient's Chart, lab work & pertinent test results  Airway Mallampati: II  TM Distance: >3 FB Neck ROM: Full    Dental no notable dental hx. (+) Teeth Intact, Dental Advisory Given   Pulmonary sleep apnea ,    Pulmonary exam normal breath sounds clear to auscultation       Cardiovascular Exercise Tolerance: Good Normal cardiovascular exam+ dysrhythmias Atrial Fibrillation  Rhythm:Irregular Rate:Normal  07/12/20 TEE  1. Left ventricular ejection fraction, by estimation, is 50 to 55%. The  left ventricle has low normal function. The left ventricle has no regional  wall motion abnormalities. Diastolic function not evaluated due to rapid  Atrial Flutter.  2. Right ventricular systolic function is normal. The right ventricular  size is normal.  3. 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 The LAA  emptying velocity was 43 cm/s.  4. Spontaneous echo contrast present in right atrium.  5. The mitral valve is normal in structure. Trivial mitral valve  regurgitation. No evidence of mitral stenosis.  6. The aortic valve is tricuspid. Aortic valve regurgitation is mild. No  aortic stenosis is present.    Neuro/Psych negative neurological ROS  negative psych ROS   GI/Hepatic negative GI ROS, Neg liver ROS,   Endo/Other  negative endocrine ROS  Renal/GU      Musculoskeletal   Abdominal   Peds  Hematology  (+) anemia , Lab Results      Component                Value               Date                      WBC                      4.3                 07/26/2020                HGB                      15.0                07/26/2020                HCT                      45.0                07/26/2020                MCV                      88.1                07/26/2020                 PLT                      137 (L)             07/26/2020              Anesthesia Other Findings   Reproductive/Obstetrics  Anesthesia Physical Anesthesia Plan  ASA: 3  Anesthesia Plan: General   Post-op Pain Management:    Induction: Intravenous  PONV Risk Score and Plan: 3 and Treatment may vary due to age or medical condition, Ondansetron and Midazolam  Airway Management Planned: Oral ETT  Additional Equipment: None  Intra-op Plan:   Post-operative Plan: Extubation in OR  Informed Consent: I have reviewed the patients History and Physical, chart, labs and discussed the procedure including the risks, benefits and alternatives for the proposed anesthesia with the patient or authorized representative who has indicated his/her understanding and acceptance.     Dental advisory given  Plan Discussed with: CRNA and Anesthesiologist  Anesthesia Plan Comments:        Anesthesia Quick Evaluation

## 2020-11-03 NOTE — Pre-Procedure Instructions (Signed)
Attempted to call patient regarding procedure instructions fro tomorrow.  Left voicemail on the following items: Arrival time 0830 Nothing to eat or drink after midnight No meds AM of procedure Responsible person to drive you home and stay with you for 24 hrs  Have you missed any doses of anti-coagulant Eliquis both doses today, none in the morning

## 2020-11-04 ENCOUNTER — Encounter (HOSPITAL_COMMUNITY)
Admission: RE | Disposition: A | Discharge status: Home / Self Care | Payer: Self-pay | Source: Home / Self Care | Attending: Cardiology

## 2020-11-04 ENCOUNTER — Ambulatory Visit (HOSPITAL_COMMUNITY): Payer: PRIVATE HEALTH INSURANCE | Admitting: Certified Registered Nurse Anesthetist

## 2020-11-04 ENCOUNTER — Ambulatory Visit (HOSPITAL_COMMUNITY)
Admission: RE | Admit: 2020-11-04 | Discharge: 2020-11-04 | Disposition: A | Discharge status: Home / Self Care | Payer: PRIVATE HEALTH INSURANCE | Attending: Cardiology | Admitting: Cardiology

## 2020-11-04 ENCOUNTER — Encounter (HOSPITAL_COMMUNITY): Payer: Self-pay | Admitting: Cardiology

## 2020-11-04 ENCOUNTER — Other Ambulatory Visit: Payer: Self-pay

## 2020-11-04 DIAGNOSIS — I480 Paroxysmal atrial fibrillation: Secondary | ICD-10-CM | POA: Insufficient documentation

## 2020-11-04 DIAGNOSIS — I483 Typical atrial flutter: Secondary | ICD-10-CM | POA: Diagnosis not present

## 2020-11-04 DIAGNOSIS — Z8249 Family history of ischemic heart disease and other diseases of the circulatory system: Secondary | ICD-10-CM | POA: Insufficient documentation

## 2020-11-04 HISTORY — PX: ATRIAL FIBRILLATION ABLATION: EP1191

## 2020-11-04 LAB — POCT ACTIVATED CLOTTING TIME
Activated Clotting Time: 294 seconds
Activated Clotting Time: 300 seconds
Activated Clotting Time: 335 seconds

## 2020-11-04 SURGERY — ATRIAL FIBRILLATION ABLATION
Anesthesia: General

## 2020-11-04 MED ORDER — DOBUTAMINE IN D5W 4-5 MG/ML-% IV SOLN
INTRAVENOUS | Status: DC | PRN
  Administered 2020-11-04: 20 ug/kg/min via INTRAVENOUS

## 2020-11-04 MED ORDER — DEXAMETHASONE SODIUM PHOSPHATE 10 MG/ML IJ SOLN
INTRAMUSCULAR | Status: DC | PRN
  Administered 2020-11-04: 5 mg via INTRAVENOUS

## 2020-11-04 MED ORDER — SUGAMMADEX SODIUM 200 MG/2ML IV SOLN
INTRAVENOUS | Status: DC | PRN
  Administered 2020-11-04: 200 mg via INTRAVENOUS

## 2020-11-04 MED ORDER — SODIUM CHLORIDE 0.9 % IV SOLN: 250.0000 mL | INTRAVENOUS | Status: DC | PRN

## 2020-11-04 MED ORDER — ACETAMINOPHEN 325 MG PO TABS: 650.0000 mg | ORAL_TABLET | ORAL | Status: DC | PRN

## 2020-11-04 MED ORDER — SODIUM CHLORIDE 0.9 % IV SOLN
INTRAVENOUS | Status: DC | PRN
Start: 2020-11-04 — End: 2020-11-04

## 2020-11-04 MED ORDER — ONDANSETRON HCL 4 MG/2ML IJ SOLN
INTRAMUSCULAR | Status: DC | PRN
  Administered 2020-11-04: 4 mg via INTRAVENOUS

## 2020-11-04 MED ORDER — SODIUM CHLORIDE 0.9 % IV SOLN: INTRAVENOUS | Status: DC

## 2020-11-04 MED ORDER — ONDANSETRON HCL 4 MG/2ML IJ SOLN: 4.0000 mg | Freq: Four times a day (QID) | INTRAMUSCULAR | Status: DC | PRN

## 2020-11-04 MED ORDER — HEPARIN SODIUM (PORCINE) 1000 UNIT/ML IJ SOLN
INTRAMUSCULAR | Status: AC
  Filled 2020-11-04: qty 1

## 2020-11-04 MED ORDER — HEPARIN SODIUM (PORCINE) 1000 UNIT/ML IJ SOLN
INTRAMUSCULAR | Status: DC | PRN
  Administered 2020-11-04: 2000 [IU] via INTRAVENOUS
  Administered 2020-11-04: 4000 [IU] via INTRAVENOUS
  Administered 2020-11-04: 3000 [IU] via INTRAVENOUS
  Administered 2020-11-04: 14000 [IU] via INTRAVENOUS

## 2020-11-04 MED ORDER — ROCURONIUM BROMIDE 10 MG/ML (PF) SYRINGE
PREFILLED_SYRINGE | INTRAVENOUS | Status: DC | PRN
Start: 2020-11-04 — End: 2020-11-04
  Administered 2020-11-04: 60 mg via INTRAVENOUS

## 2020-11-04 MED ORDER — PHENYLEPHRINE 40 MCG/ML (10ML) SYRINGE FOR IV PUSH (FOR BLOOD PRESSURE SUPPORT)
PREFILLED_SYRINGE | INTRAVENOUS | Status: DC | PRN
  Administered 2020-11-04 (×2): 80 ug via INTRAVENOUS

## 2020-11-04 MED ORDER — PHENYLEPHRINE HCL-NACL 20-0.9 MG/250ML-% IV SOLN
INTRAVENOUS | Status: DC | PRN
  Administered 2020-11-04: 20 ug/min via INTRAVENOUS

## 2020-11-04 MED ORDER — FENTANYL CITRATE (PF) 100 MCG/2ML IJ SOLN
INTRAMUSCULAR | Status: DC | PRN
  Administered 2020-11-04: 100 ug via INTRAVENOUS

## 2020-11-04 MED ORDER — LIDOCAINE 2% (20 MG/ML) 5 ML SYRINGE
INTRAMUSCULAR | Status: DC | PRN
Start: 2020-11-04 — End: 2020-11-04
  Administered 2020-11-04: 100 mg via INTRAVENOUS

## 2020-11-04 MED ORDER — HEPARIN (PORCINE) IN NACL 1000-0.9 UT/500ML-% IV SOLN
INTRAVENOUS | Status: DC | PRN
  Administered 2020-11-04 (×5): 500 mL

## 2020-11-04 MED ORDER — PROTAMINE SULFATE 10 MG/ML IV SOLN
INTRAVENOUS | Status: DC | PRN
Start: 2020-11-04 — End: 2020-11-04
  Administered 2020-11-04: 40 mg via INTRAVENOUS

## 2020-11-04 MED ORDER — SODIUM CHLORIDE 0.9% FLUSH: 3.0000 mL | INTRAVENOUS | Status: DC | PRN

## 2020-11-04 MED ORDER — GLYCOPYRROLATE PF 0.2 MG/ML IJ SOSY
PREFILLED_SYRINGE | INTRAMUSCULAR | Status: DC | PRN
  Administered 2020-11-04: .2 mg via INTRAVENOUS

## 2020-11-04 MED ORDER — HEPARIN (PORCINE) IN NACL 2000-0.9 UNIT/L-% IV SOLN
INTRAVENOUS | Status: DC | PRN
  Administered 2020-11-04: 1000 mL

## 2020-11-04 MED ORDER — PROPOFOL 10 MG/ML IV BOLUS
INTRAVENOUS | Status: DC | PRN
  Administered 2020-11-04: 170 mg via INTRAVENOUS

## 2020-11-04 SURGICAL SUPPLY — 18 items
CATH OCTARAY 1.5 F (CATHETERS) ×1 IMPLANT
CATH S CIRCA THERM PROBE 10F (CATHETERS) ×1 IMPLANT
CATH SMTCH THERMOCOOL SF DF (CATHETERS) ×1 IMPLANT
CATH SOUNDSTAR ECO 8FR (CATHETERS) ×1 IMPLANT
CATH WEB BI DIR CSDF CRV REPRO (CATHETERS) ×1 IMPLANT
CLOSURE PERCLOSE PROSTYLE (VASCULAR PRODUCTS) ×4 IMPLANT
COVER SWIFTLINK CONNECTOR (BAG) ×2 IMPLANT
KIT VERSACROSS STEERABLE D1 (CATHETERS) ×1 IMPLANT
PACK EP LATEX FREE (CUSTOM PROCEDURE TRAY) ×2
PACK EP LF (CUSTOM PROCEDURE TRAY) ×1 IMPLANT
PAD PRO RADIOLUCENT 2001M-C (PAD) ×2 IMPLANT
PATCH CARTO3 (PAD) ×1 IMPLANT
SHEATH CARTO VIZIGO SM CVD (SHEATH) ×1 IMPLANT
SHEATH PINNACLE 7F 10CM (SHEATH) ×1 IMPLANT
SHEATH PINNACLE 8F 10CM (SHEATH) ×2 IMPLANT
SHEATH PINNACLE 9F 10CM (SHEATH) ×1 IMPLANT
SHEATH PROBE COVER 6X72 (BAG) ×1 IMPLANT
TUBING SMART ABLATE COOLFLOW (TUBING) ×1 IMPLANT

## 2020-11-04 NOTE — Discharge Instructions (Addendum)
Post procedure care instructions No driving for 4 days. No lifting over 5 lbs for 1 week. No vigorous or sexual activity for 1 week. You may return to work/your usual activities on 11/12/20. Keep procedure site clean & dry. If you notice increased pain, swelling, bleeding or pus, call/return!  You may shower after 24 hours, but no soaking in baths/hot tubs/pools for 1 week.    You have an appointment set up with the At[facility]ibrillation Clinic.  Multiple studies have shown that being followed by a dedicated atrial fibrillation clinic in addition to the standard care you receive from your other physicians improves health. We believe that enrollment in the atrial fibrillation clinic will allow [place] to better care for you.   The phone number to the Atrial Fibrillation Clinic is [phone]. The clinic is staffed Monday through Friday from 8:30am to 5pm.  Parking Directions: The clinic is located in the Heart and Vascular Building connected to [facility]. 1)From [address] turn on to [address] and go to the 3rd entrance  (Heart and Vascular entrance) on the right. 2)Look to the right for Heart &Vascular Parking Garage. 3)A code for the entrance is required, for September is 4455  4)Take the elevators to the 1st floor. Registration is in the room with the glass walls at the end of the hallway.  If you have any trouble parking or locating the clinic, please don't hesitate to call [phone].  Cardiac Ablation, Care After  This sheet gives you information about how to care for yourself after your procedure. Your health care provider may also give you more specific instructions. If you have problems or questions, contact your health care provider. What can I expect after the procedure? After the procedure, it is common to have: Bruising around your puncture site. Tenderness around your puncture site. Skipped heartbeats. Tiredness (fatigue).  Follow these instructions at  home: Puncture site care  Follow instructions from your health care provider about how to take care of your puncture site. Make sure you: If present, leave stitches (sutures), skin glue, or adhesive strips in place. These skin closures may need to stay in place for up to 2 weeks. If adhesive strip edges start to loosen and curl up, you may trim the loose edges. Do not remove adhesive strips completely unless your health care provider tells you to do that. If a large square bandage is present, this may be removed 24 hours after surgery.  Check your puncture site every day for signs of infection. Check for: Redness, swelling, or pain. Fluid or blood. If your puncture site starts to bleed, lie down on your back, apply firm pressure to the area, and contact your health care provider. Warmth. Pus or a bad smell. Driving Do not drive for at least 4 days after your procedure or however long your health care provider recommends. (Do not resume driving if you have previously been instructed not to drive for other health reasons.) Do not drive or use heavy machinery while taking prescription pain medicine. Activity Avoid activities that take a lot of effort for at least 7 days after your procedure. Do not lift anything that is heavier than 5 lb (4.5 kg) for one week.  No sexual activity for 1 week.  Return to your normal activities as told by your health care provider. Ask your health care provider what activities are safe for you. General instructions Take over-the-counter and prescription medicines only as told by your health care provider. Do  not use any products that contain nicotine or tobacco, such as cigarettes and e-cigarettes. If you need help quitting, ask your health care provider. You may shower after 24 hours, but Do not take baths, swim, or use a hot tub for 1 week.  Do not drink alcohol for 24 hours after your procedure. Keep all follow-up visits as told by your health care provider. This  is important. Contact a health care provider if: You have redness, mild swelling, or pain around your puncture site. You have fluid or blood coming from your puncture site that stops after applying firm pressure to the area. Your puncture site feels warm to the touch. You have pus or a bad smell coming from your puncture site. You have a fever. You have chest pain or discomfort that spreads to your neck, jaw, or arm. You are sweating a lot. You feel nauseous. You have a fast or irregular heartbeat. You have shortness of breath. You are dizzy or light-headed and feel the need to lie down. You have pain or numbness in the arm or leg closest to your puncture site. Get help right away if: Your puncture site suddenly swells. Your puncture site is bleeding and the bleeding does not stop after applying firm pressure to the area. These symptoms may represent a serious problem that is an emergency. Do not wait to see if the symptoms will go away. Get medical help right away. Call your local emergency services (911 in the [place] Do not drive yourself to the hospital. Summary After the procedure, it is normal to have bruising and tenderness at the puncture site in your groin, neck, or forearm. Check your puncture site every day for signs of infection. Get help right away if your puncture site is bleeding and the bleeding does not stop after applying firm pressure to the area. This is a medical emergency. This information is not intended to replace advice given to you by your health care provider. Make sure you discuss any questions you have with your health care provider.

## 2020-11-04 NOTE — Anesthesia Procedure Notes (Signed)
Procedure Name: Intubation Date/Time: 11/04/2020 11:01 AM Performed by: Dorthea Cove, CRNA Pre-anesthesia Checklist: Patient identified, Emergency Drugs available, Suction available and Patient being monitored Patient Re-evaluated:Patient Re-evaluated prior to induction Oxygen Delivery Method: Circle system utilized Preoxygenation: Pre-oxygenation with 100% oxygen Induction Type: IV induction and Cricoid Pressure applied Ventilation: Mask ventilation without difficulty Laryngoscope Size: Mac and 4 Grade View: Grade II Tube type: Oral Tube size: 7.5 mm Number of attempts: 1 Airway Equipment and Method: Stylet and Oral airway Placement Confirmation: ETT inserted through vocal cords under direct vision, positive ETCO2 and breath sounds checked- equal and bilateral Secured at: 22 cm Tube secured with: Tape Dental Injury: Teeth and Oropharynx as per pre-operative assessment

## 2020-11-04 NOTE — H&P (Signed)
Electrophysiology Office Note   Date:  11/04/2020   ID:  Marco White, DOB [birth year], MRN [id number]  PCP:  Darnelle Going, MD  Cardiologist:  Odis Hollingshead Primary Electrophysiologist:  Shadoe Bethel Jorja Loa, MD    Chief Complaint: atrial flutter   History of Present Illness: Marco White is a 65 y.o. male who is being seen today for the evaluation of atrial flutter at the request of No ref. provider found. Presenting today for electrophysiology evaluation.  He has a history significant for atrial flutter.  He presented to the hospital 07/26/2020 in atrial flutter.  He is now status post atrial flutter ablation on 07/27/2020.  He continued to have palpitations after his ablation and was found to have atrial fibrillation.  Today, denies symptoms of palpitations, chest pain, shortness of breath, orthopnea, PND, lower extremity edema, claudication, dizziness, presyncope, syncope, bleeding, or neurologic sequela. The patient is tolerating medications without difficulties. AF ablation today.    Past Medical History:  Diagnosis Date   Anemia    Past Surgical History:  Procedure Laterality Date   A-FLUTTER ABLATION N/A 07/27/2020   Procedure: A-FLUTTER ABLATION;  Surgeon: Regan Lemming, MD;  [facility]: [facility];  Service: Cardiovascular;  Laterality: N/A;   CARDIOVERSION N/A 07/12/2020   Procedure: CARDIOVERSION;  Surgeon: Tessa Lerner, DO;  Location: [facility];  Service: Cardiovascular;  Laterality: N/A;   HERNIA REPAIR     LUMBAR MICRODISCECTOMY     MENISCUS REPAIR     TEE WITHOUT CARDIOVERSION N/A 07/12/2020   Procedure: TRANSESOPHAGEAL ECHOCARDIOGRAM (TEE);  Surgeon: Tessa Lerner, DO;  Location: [facility];  Service: Cardiovascular;  Laterality: N/A;     Current Facility-Administered Medications  Medication Dose Route Frequency Provider Last Rate Last Admin   0.9 %  sodium chloride infusion   Intravenous Continuous Manette Doto, Andree Coss, MD        Allergies:    Patient has no known allergies.   Social History:  The patient  reports that he has never smoked. He has never used smokeless tobacco. He reports current alcohol use. He reports that he does not use drugs.   Family History:  The patient's family history includes Atrial fibrillation in his father and mother; Polycystic kidney disease in his mother; Prostate cancer in his father.   ROS:  Please see the history of present illness.   Otherwise, review of systems is positive for none.   All other systems are reviewed and negative.   PHYSICAL EXAM: VS:  BP 126/66   Pulse (!) 57   Temp 98.2 F (36.8 C) (Oral)   Ht 5\' 11"  (1.803 m)   Wt 72.6 kg   SpO2 99%   BMI 22.32 kg/m  , BMI Body mass index is 22.32 kg/m. GEN: Well nourished, well developed, in no acute distress  HEENT: normal  Neck: no JVD, carotid bruits, or masses Cardiac: RRR; no murmurs, rubs, or gallops,no edema  Respiratory:  clear to auscultation bilaterally, normal work of breathing GI: soft, nontender, nondistended, + BS MS: no deformity or atrophy  Skin: warm and dry Neuro:  Strength and sensation are intact Psych: euthymic mood, full affect  Recent Labs: 07/09/2020: TSH 3.541 07/13/2020: B Natriuretic Peptide 42.0; Magnesium 2.0 07/26/2020: BUN 22; Creatinine, Ser 0.89; Hemoglobin 15.0; Platelets 137; Potassium 3.9; Sodium 136    Lipid Panel     Component Value Date/Time   CHOL 198 07/13/2020 0155   TRIG 102 07/13/2020 0155   HDL 50 07/13/2020 0155   CHOLHDL 4.0  07/13/2020 0155   VLDL 20 07/13/2020 0155   LDLCALC 128 (H) 07/13/2020 0155   LDLDIRECT 126.2 (H) 07/13/2020 0155     Wt Readings from Last 3 Encounters:  11/04/20 72.6 kg  09/12/20 71.7 kg  08/09/20 71.7 kg      Other studies Reviewed: Additional studies/ records that were reviewed today include: Cardiac monitor 09/11/2020 personally reviewed Review of the above records today demonstrates:  Patient had a min HR of 30 bpm, max HR of 214 bpm, and  avg HR of 62 bpm. Predominant underlying rhythm was Sinus Rhythm. 5 Ventricular Tachycardia runs occurred, fastest lasting 6 beats with a max rate of 214 bpm, the longest lasting 13 beats with an avg rate of 105 bpm. Atrial Fibrillation occurred (8% burden), ranging from 54-200 bpm (avg of 94 bpm), the longest lasting 9 hours 41 mins with an avg rate of 99 bpm. Ventricular Tachycardia and Atrial Fibrillation were detected  within +/- 45 seconds of symptomatic patient event(s). There were rare PVCs and PACs.  No heart block.  Correct   ASSESSMENT AND PLAN:  1.  Typical atrial flutter: Status post ablation 07/27/2020.  He has had no further episodes of atrial flutter based on most recent monitoring.  2.  Paroxysmal atrial fibrillation: Marco White has presented today for surgery, with the diagnosis of atrial fibrillation.  The various methods of treatment have been discussed with the patient and family. After consideration of risks, benefits and other options for treatment, the patient has consented to  Procedure(s): Catheter ablation as a surgical intervention .  Risks include but not limited to complete heart block, stroke, esophageal damage, nerve damage, bleeding, vascular damage, tamponade, perforation, MI, and death. The patient's history has been reviewed, patient examined, no change in status, stable for surgery.  I have reviewed the patient's chart and labs.  Questions were answered to the patient's satisfaction.    Marco Abdelrahman Elberta Fortis, MD 11/04/2020 10:08 AM

## 2020-11-04 NOTE — Transfer of Care (Signed)
Immediate Anesthesia Transfer of Care Note  Patient: Marco White  Procedure(s) Performed: ATRIAL FIBRILLATION ABLATION  Patient Location: Cath Lab  Anesthesia Type:General  Level of Consciousness: awake, alert  and oriented  Airway & Oxygen Therapy: Patient Spontanous Breathing and Patient connected to nasal cannula oxygen  Post-op Assessment: Report given to RN and Post -op Vital signs reviewed and stable  Post vital signs: Reviewed and stable  Last Vitals:  Vitals Value Taken Time  BP 103/57 11/04/20 1259  Temp 36.7 C 11/04/20 1258  Pulse 68 11/04/20 1259  Resp 18 11/04/20 1259  SpO2 98 % 11/04/20 1259  Vitals shown include unvalidated device data.  Last Pain:  Vitals:   11/04/20 1258  TempSrc: Temporal  PainSc: Asleep      Patients Stated Pain Goal: 3 (11/04/20 4970)  Complications: There were no known notable events for this encounter.

## 2020-11-04 NOTE — Anesthesia Postprocedure Evaluation (Signed)
Anesthesia Post Note  Patient: Marco White  Procedure(s) Performed: ATRIAL FIBRILLATION ABLATION     Patient location during evaluation: PACU Anesthesia Type: General Level of consciousness: awake and alert Pain management: pain level controlled Vital Signs Assessment: post-procedure vital signs reviewed and stable Respiratory status: spontaneous breathing, nonlabored ventilation, respiratory function stable and patient connected to nasal cannula oxygen Cardiovascular status: blood pressure returned to baseline and stable Postop Assessment: no apparent nausea or vomiting Anesthetic complications: no   There were no known notable events for this encounter.  Last Vitals:  Vitals:   11/04/20 1358 11/04/20 1413  BP: 108/61 (!) 112/58  Pulse: (!) 59 (!) 58  Resp: 13 13  Temp:    SpO2: 98% 98%    Last Pain:  Vitals:   11/04/20 1346  TempSrc:   PainSc: 0-No pain                 Trevor Iha

## 2020-11-07 ENCOUNTER — Encounter (HOSPITAL_COMMUNITY): Payer: Self-pay | Admitting: Cardiology

## 2020-11-28 ENCOUNTER — Encounter: Payer: Self-pay | Admitting: Neurology

## 2020-11-28 DIAGNOSIS — G4733 Obstructive sleep apnea (adult) (pediatric): Secondary | ICD-10-CM

## 2020-11-28 DIAGNOSIS — I4892 Unspecified atrial flutter: Secondary | ICD-10-CM

## 2020-11-29 NOTE — Addendum Note (Signed)
Addended by: Huston Foley on: 11/29/2020 02:09 PM   Modules accepted: Orders

## 2020-11-29 NOTE — Telephone Encounter (Signed)
I reviewed patient's night balance data report.  Date ranges 10/26/2020 through 11/25/2020.  He has an average estimated sleep duration of 6 hours and 21 minutes, 7.6% sleep on the back.  I recommend reevaluation of his sleep apnea with a home sleep test while he is using his [employer]ips night balance device.

## 2020-12-02 ENCOUNTER — Ambulatory Visit (HOSPITAL_COMMUNITY): Payer: No Typology Code available for payment source | Admitting: Physician Assistant

## 2020-12-09 ENCOUNTER — Ambulatory Visit (HOSPITAL_COMMUNITY)
Admission: RE | Admit: 2020-12-09 | Discharge: 2020-12-09 | Disposition: A | Discharge status: Home / Self Care | Payer: No Typology Code available for payment source | Source: Ambulatory Visit | Attending: Physician Assistant | Admitting: Physician Assistant

## 2020-12-09 ENCOUNTER — Other Ambulatory Visit: Payer: Self-pay

## 2020-12-09 ENCOUNTER — Encounter (HOSPITAL_COMMUNITY): Payer: Self-pay | Admitting: Physician Assistant

## 2020-12-09 VITALS — BP 102/62 | HR 66 | Ht 71.0 in | Wt 157.6 lb

## 2020-12-09 DIAGNOSIS — G4733 Obstructive sleep apnea (adult) (pediatric): Secondary | ICD-10-CM | POA: Diagnosis not present

## 2020-12-09 DIAGNOSIS — Z8249 Family history of ischemic heart disease and other diseases of the circulatory system: Secondary | ICD-10-CM | POA: Insufficient documentation

## 2020-12-09 DIAGNOSIS — Z79899 Other long term (current) drug therapy: Secondary | ICD-10-CM | POA: Insufficient documentation

## 2020-12-09 DIAGNOSIS — I480 Paroxysmal atrial fibrillation: Secondary | ICD-10-CM

## 2020-12-09 DIAGNOSIS — I4892 Unspecified atrial flutter: Secondary | ICD-10-CM | POA: Diagnosis not present

## 2020-12-09 DIAGNOSIS — Z7901 Long term (current) use of anticoagulants: Secondary | ICD-10-CM | POA: Insufficient documentation

## 2020-12-09 NOTE — Progress Notes (Signed)
Primary Care Physician: Darnelle Going, MD Primary Cardiologist: Dr Jacinto Halim Primary Electrophysiologist: Dr Elberta Fortis Referring Physician: Dr Anselm Pancoast is a 65 y.o. male with a history of OSA, atrial flutter, atrial fibrillation who presents for consultation in the [facility].  He presented to the hospital 07/26/2020 in atrial flutter.  He is now status post atrial flutter ablation on 07/27/2020.  He continued to have palpitations after his ablation and was found to have atrial fibrillation. Patient is on Eliquis for a CHADS2VASC score of 0. He underwent afib ablation with Dr Elberta Fortis on 11/04/20. Patient reports that he has had some breakthrough episodes of afib but these are improving. He denies CP, swallowing pain, or groin issues.   Today, he denies symptoms of chest pain, shortness of breath, orthopnea, PND, lower extremity edema, dizziness, presyncope, syncope, snoring, daytime somnolence, bleeding, or neurologic sequela. The patient is tolerating medications without difficulties and is otherwise without complaint today.    Atrial Fibrillation Risk Factors:  he does have symptoms or diagnosis of sleep apnea. he does not have a history of rheumatic fever.   he has a BMI of Body mass index is 21.98 kg/m.Marland Kitchen Filed Weights   12/09/20 1040  Weight: 71.5 kg    Family History  Problem Relation Age of Onset   Atrial fibrillation Mother    Polycystic kidney disease Mother    Atrial fibrillation Father    Prostate cancer Father      Atrial Fibrillation Management history:  Previous antiarrhythmic drugs: none Previous cardioversions: 07/12/20 Previous ablations: 07/27/20, 11/04/20 CHADS2VASC score: 0 Anticoagulation history: Eliquis   Past Medical History:  Diagnosis Date   Anemia    Past Surgical History:  Procedure Laterality Date   A-FLUTTER ABLATION N/A 07/27/2020   Procedure: A-FLUTTER ABLATION;  Surgeon: Regan Lemming, MD;   [facility];  Service: Cardiovascular;  Laterality: N/A;   ATRIAL FIBRILLATION ABLATION N/A 11/04/2020   Procedure: ATRIAL FIBRILLATION ABLATION;  Surgeon: Regan Lemming, MD;  [facility]: [facility];  Service: Cardiovascular;  Laterality: N/A;   CARDIOVERSION N/A 07/12/2020   Procedure: CARDIOVERSION;  Surgeon: Tessa Lerner, DO;  Location: [facility];  Service: Cardiovascular;  Laterality: N/A;   HERNIA REPAIR     LUMBAR MICRODISCECTOMY     MENISCUS REPAIR     TEE WITHOUT CARDIOVERSION N/A 07/12/2020   Procedure: TRANSESOPHAGEAL ECHOCARDIOGRAM (TEE);  Surgeon: Tessa Lerner, DO;  Location: [facility];  Service: Cardiovascular;  Laterality: N/A;    Current Outpatient Medications  Medication Sig Dispense Refill   apixaban (ELIQUIS) 5 MG TABS tablet Take 1 tablet (5 mg total) by mouth 2 (two) times daily. 60 tablet 0   cholecalciferol (VITAMIN D3) 25 MCG (1000 UNIT) tablet Take 1,000 Units by mouth daily.     finasteride (PROPECIA) 1 MG tablet Take 1 mg by mouth daily.     No current facility-administered medications for this encounter.    No Known Allergies  Social History   Socioeconomic History   Marital status: Married    Spouse name: Not on file   Number of children: Not on file   Years of education: Not on file   Highest education level: Not on file  Occupational History   Not on file  Tobacco Use   Smoking status: Never   Smokeless tobacco: Never  Vaping Use   Vaping Use: Never used  Substance and Sexual Activity   Alcohol use: Yes  Comment: Occasional   Drug use: Never   Sexual activity: Not on file  Other Topics Concern   Not on file  Social History Narrative   Not on file   Social Determinants of Health   Financial Resource Strain: Not on file  Food Insecurity: Not on file  Transportation Needs: Not on file  Physical Activity: Not on file  Stress: Not on file  Social Connections: Not on file  Intimate Partner Violence: Not  on file     ROS- All systems are reviewed and negative except as per the HPI above.  Physical Exam: Vitals:   12/09/20 1040  BP: 102/62  Pulse: 66  Weight: 71.5 kg  Height: 5\' 11"  (1.803 m)    GEN- The patient is a well appearing male, alert and oriented x 3 today.   Head- normocephalic, atraumatic Eyes-  Sclera clear, conjunctiva pink Ears- hearing intact Oropharynx- clear Neck- supple  Lungs- Clear to ausculation bilaterally, normal work of breathing Heart- Regular rate and rhythm, no murmurs, rubs or gallops  GI- soft, NT, ND, + BS Extremities- no clubbing, cyanosis, or edema MS- no significant deformity or atrophy Skin- no rash or lesion Psych- euthymic mood, full affect Neuro- strength and sensation are intact  Wt Readings from Last 3 Encounters:  12/09/20 71.5 kg  11/04/20 72.6 kg  09/12/20 71.7 kg    EKG today demonstrates  SR Vent. rate 66 BPM PR interval 138 ms QRS duration 90 ms QT/QTcB 388/406 ms  Echo 07/10/20 demonstrated  1. Left ventricular ejection fraction, by estimation, is 50 to 55%. The  left ventricle has low normal function. The left ventricle has no regional wall motion abnormalities. Diastolic dysfunction is not assessed due to atrial flutter.   2. Right ventricular systolic function is mildly reduced. The right  ventricular size is mildly enlarged. There is normal pulmonary artery  systolic pressure.   3. Right atrial size was mildly dilated.   4. The mitral valve is normal in structure. No evidence of mitral valve  regurgitation. No evidence of mitral stenosis.   5. The aortic valve is tricuspid. Aortic valve regurgitation is mild to  moderate. No aortic stenosis is present.   6. The inferior vena cava is dilated in size with >50% respiratory  variability, suggesting right atrial pressure of 8 mmHg.   Epic records are reviewed at length today  CHA2DS2-VASc Score = 0  The patient's score is based upon: CHF History: 0 HTN History:  0 Diabetes History: 0 Stroke History: 0 Vascular Disease History: 0 Age Score: 0 Gender Score: 0       ASSESSMENT AND PLAN: 1. Paroxysmal Atrial Fibrillation/atrial flutter The patient's CHA2DS2-VASc score is 0, indicating a 0.2% annual risk of stroke.   S/p flutter ablation 07/2020 and afib ablation 11/04/20 Patient appears to be maintaining SR. Reassured that some breakthrough episodes of afib are not uncommon post ablation. Continue Eliquis 5 mg BID with no missed doses for 3 months post ablation.   2. Obstructive sleep apnea Followed by Dr 01/04/21.   Follow up with Dr Frances Furbish as scheduled.    Elberta Fortis PA-C Afib Clinic [facility] [address] [place], [place] [id number] 12/09/2020 10:55 AM

## 2021-01-20 ENCOUNTER — Telehealth: Payer: Self-pay | Admitting: Cardiology

## 2021-01-20 NOTE — Telephone Encounter (Signed)
Patient said he needs the authorization for his CT sent to [facility]. His insurance company said they did not get the authorization.   [employer] can be reached by phone at [phone]. He does not have a fax number .  Please reach out to the patient once the office has sent the authorization

## 2021-02-06 ENCOUNTER — Ambulatory Visit: Payer: No Typology Code available for payment source | Admitting: Cardiology

## 2021-02-22 ENCOUNTER — Encounter: Payer: Self-pay | Admitting: Cardiology

## 2021-02-22 ENCOUNTER — Other Ambulatory Visit: Payer: Self-pay

## 2021-02-22 ENCOUNTER — Ambulatory Visit (INDEPENDENT_AMBULATORY_CARE_PROVIDER_SITE_OTHER): Payer: No Typology Code available for payment source | Admitting: Cardiology

## 2021-02-22 VITALS — BP 102/58 | HR 58 | Ht 71.0 in | Wt 161.8 lb

## 2021-02-22 DIAGNOSIS — I480 Paroxysmal atrial fibrillation: Secondary | ICD-10-CM | POA: Diagnosis not present

## 2021-02-22 NOTE — Progress Notes (Signed)
Electrophysiology Office Note   Date:  02/22/2021   ID:  Marco White, DOB 11/09/1955, MRN [id number]  PCP:  Darnelle Going, MD  Cardiologist:  Odis Hollingshead Primary Electrophysiologist:  Kermitt Harjo Jorja Loa, MD    Chief Complaint: atrial flutter   History of Present Illness: Marco White is a 65 y.o. male who is being seen today for the evaluation of atrial flutter at the request of Darnelle Going, MD. Presenting today for electrophysiology evaluation.  He has a history significant for atrial flutter.  He presented to the hospital 07/26/2020 and atrial flutter.  He is status post ablation 07/27/2020.  He continued to have palpitations after ablation and was found to have atrial fibrillation.  He is now status post ablation 11/04/2020.  Today, denies symptoms of palpitations, chest pain, shortness of breath, orthopnea, PND, lower extremity edema, claudication, dizziness, presyncope, syncope, bleeding, or neurologic sequela. The patient is tolerating medications without difficulties.  Since being seen he has done well.  He has had no chest pain or shortness of breath.  He is able do all of his daily activities.  He initially had further episodes of atrial fibrillation, but since 1 month after the procedure, he has had none.  He is overall comfortable with his control.   Past Medical History:  Diagnosis Date   Anemia    Past Surgical History:  Procedure Laterality Date   A-FLUTTER ABLATION N/A 07/27/2020   Procedure: A-FLUTTER ABLATION;  Surgeon: Regan Lemming, MD;  Location: [facility];  Service: Cardiovascular;  Laterality: N/A;   ATRIAL FIBRILLATION ABLATION N/A 11/04/2020   Procedure: ATRIAL FIBRILLATION ABLATION;  Surgeon: Regan Lemming, MD;  Location: [facility];  Service: Cardiovascular;  Laterality: N/A;   CARDIOVERSION N/A 07/12/2020   Procedure: CARDIOVERSION;  Surgeon: Tessa Lerner, DO;  Location: [facility];  Service: Cardiovascular;  Laterality:  N/A;   HERNIA REPAIR     LUMBAR MICRODISCECTOMY     MENISCUS REPAIR     TEE WITHOUT CARDIOVERSION N/A 07/12/2020   Procedure: TRANSESOPHAGEAL ECHOCARDIOGRAM (TEE);  Surgeon: Tessa Lerner, DO;  Location: [facility];  Service: Cardiovascular;  Laterality: N/A;     Current Outpatient Medications  Medication Sig Dispense Refill   cholecalciferol (VITAMIN D3) 25 MCG (1000 UNIT) tablet Take 1,000 Units by mouth daily.     finasteride (PROPECIA) 1 MG tablet Take 1 mg by mouth daily.     No current facility-administered medications for this visit.    Allergies:   Patient has no known allergies.   Social History:  The patient  reports that he has never smoked. He has never used smokeless tobacco. He reports current alcohol use. He reports that he does not use drugs.   Family History:  The patient's family history includes Atrial fibrillation in his father and mother; Polycystic kidney disease in his mother; Prostate cancer in his father.   ROS:  Please see the history of present illness.   Otherwise, review of systems is positive for none.   All other systems are reviewed and negative.   PHYSICAL EXAM: VS:  BP (!) 102/58   Pulse (!) 58   Ht 5\' 11"  (1.803 m)   Wt 161 lb 12.8 oz (73.4 kg)   SpO2 96%   BMI 22.57 kg/m  , BMI Body mass index is 22.57 kg/m. GEN: Well nourished, well developed, in no acute distress  HEENT: normal  Neck: no JVD, carotid bruits, or masses Cardiac: RRR; no murmurs, rubs, or gallops,no edema  Respiratory:  clear to auscultation bilaterally, normal work of breathing GI: soft, nontender, nondistended, + BS MS: no deformity or atrophy  Skin: warm and dry Neuro:  Strength and sensation are intact Psych: euthymic mood, full affect  EKG:  EKG is ordered today. Personal review of the ekg ordered shows sinus rhythm, rate 58  Recent Labs: 07/09/2020: TSH 3.541 07/13/2020: B Natriuretic Peptide 42.0; Magnesium 2.0 07/26/2020: BUN 22; Creatinine, Ser 0.89;  Hemoglobin 15.0; Platelets 137; Potassium 3.9; Sodium 136    Lipid Panel     Component Value Date/Time   CHOL 198 07/13/2020 0155   TRIG 102 07/13/2020 0155   HDL 50 07/13/2020 0155   CHOLHDL 4.0 07/13/2020 0155   VLDL 20 07/13/2020 0155   LDLCALC 128 (H) 07/13/2020 0155   LDLDIRECT 126.2 (H) 07/13/2020 0155     Wt Readings from Last 3 Encounters:  02/22/21 161 lb 12.8 oz (73.4 kg)  12/09/20 157 lb 9.6 oz (71.5 kg)  11/04/20 160 lb (72.6 kg)      Other studies Reviewed: Additional studies/ records that were reviewed today include: Cardiac monitor 09/11/2020 personally reviewed Review of the above records today demonstrates:  Patient had a min HR of 30 bpm, max HR of 214 bpm, and avg HR of 62 bpm. Predominant underlying rhythm was Sinus Rhythm. 5 Ventricular Tachycardia runs occurred, fastest lasting 6 beats with a max rate of 214 bpm, the longest lasting 13 beats with an avg rate of 105 bpm. Atrial Fibrillation occurred (8% burden), ranging from 54-200 bpm (avg of 94 bpm), the longest lasting 9 hours 41 mins with an avg rate of 99 bpm. Ventricular Tachycardia and Atrial Fibrillation were detected  within +/- 45 seconds of symptomatic patient event(s). There were rare PVCs and PACs.  No heart block.  Correct   ASSESSMENT AND PLAN:  1.  Typical atrial flutter: Status post ablation 07/27/2020.  No obvious recurrence.  2.  Paroxysmal atrial fibrillation: Found on cardiac monitor.  Has an 8% burden.  Currently on Eliquis 5 mg twice daily.  CHA2DS2-VASc of 0.  He is now status post ablation 11/04/2020.  He has had no further episodes of atrial fibrillation.  Due to that we [name] stop Eliquis.  We [name] see him back in 3 months.  He is overall happy with his control.  Current medicines are reviewed at length with the patient today.   The patient does not have concerns regarding his medicines.  The following changes were made today: Stop Eliquis  Labs/ tests ordered today include:   Orders Placed This Encounter  Procedures   EKG 12-Lead      Disposition:   FU with [name] 3 months  Signed, [name], MD  02/22/2021 3:19 PM     [name] [address] [phone] (office) [phone] (fax)

## 2021-02-22 NOTE — Patient Instructions (Signed)
Medication Instructions:  °Your physician has recommended you make the following change in your medication:  °STOP Eliquis ° °*If you need a refill on your cardiac medications before your next appointment, please call your pharmacy* ° ° °Lab Work: °None ordered ° ° °Testing/Procedures: °None ordered ° ° °Follow-Up: °At [facility], you and your health needs are our priority.  As part of our continuing mission to provide you with exceptional heart care, we have created designated Provider Care Teams.  These Care Teams include your primary Cardiologist (physician) and Advanced Practice Providers (APPs -  Physician Assistants and Nurse Practitioners) who all work together to provide you with the care you need, when you need it. ° °Your next appointment:   °3 month(s) ° °The format for your next appointment:   °In Person ° °Provider:   °Will Camnitz, MD ° ° ° °Thank you for choosing [facility]!! ° ° °Khalil Belote, RN °[phone] ° ° °  °

## 2021-05-02 ENCOUNTER — Ambulatory Visit: Payer: PRIVATE HEALTH INSURANCE | Admitting: Cardiology

## 2022-04-26 IMAGING — DX DG CHEST 1V PORT
2 series · 2 of 2 positions shown · non-contrast
Comparison: None.

CLINICAL DATA: Exertional shortness of breath. Dizziness for the
past hour. Tachycardia.

EXAM:
PORTABLE CHEST 1 VIEW

[chest ap (1 of 2)]
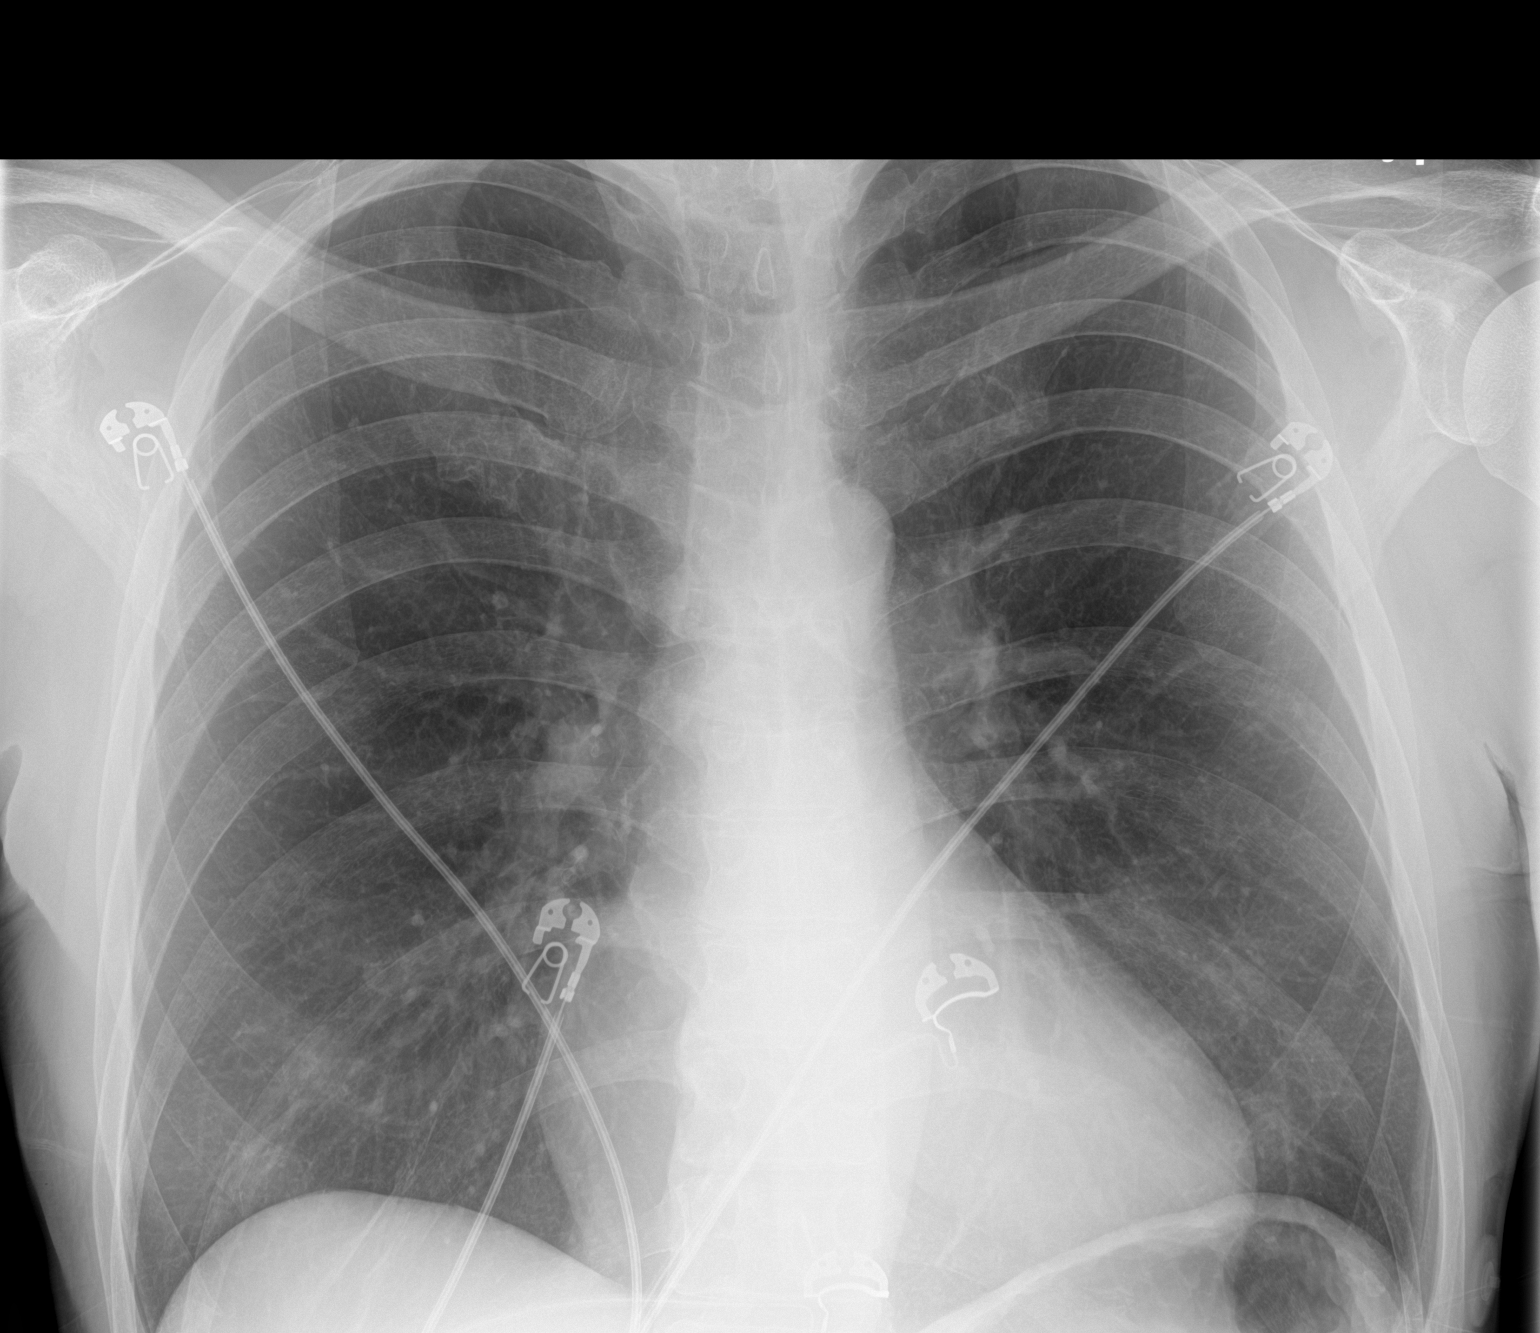

[chest ap (2 of 2)]
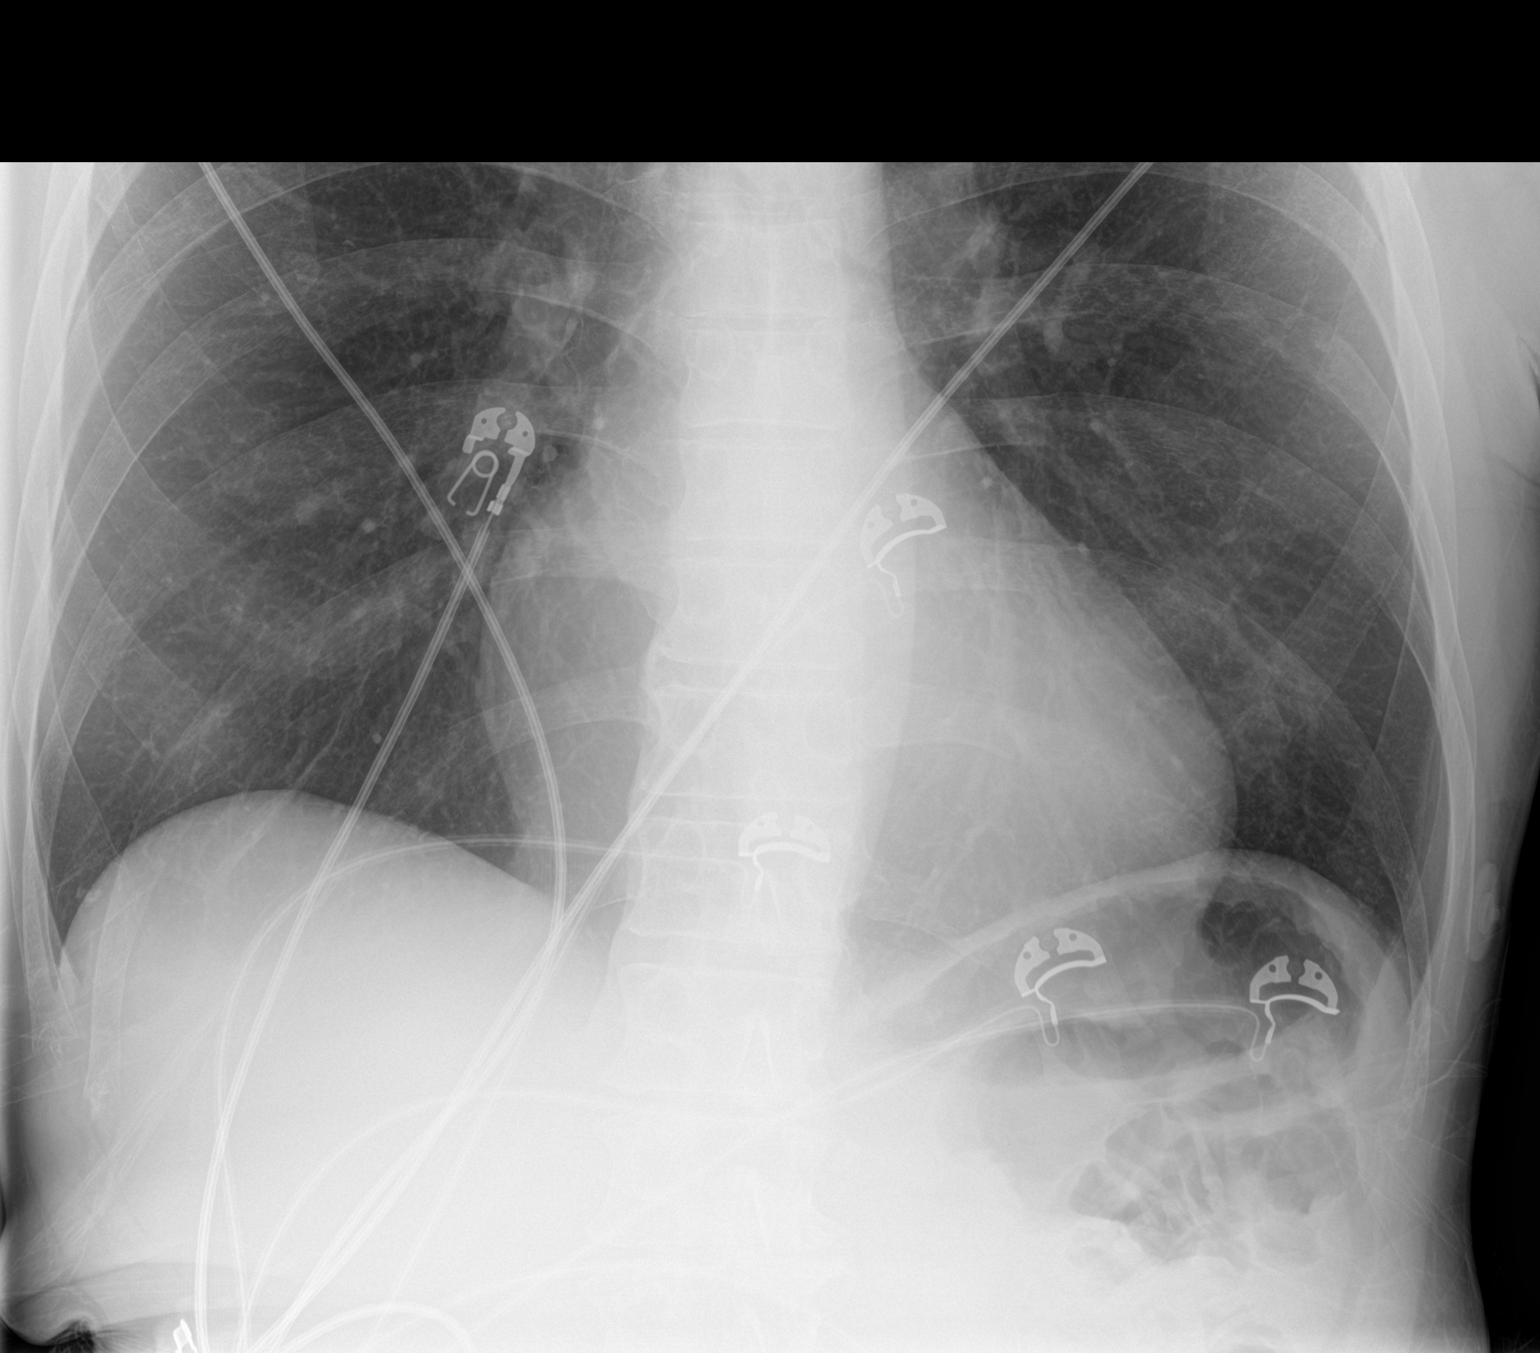

[2 of 2 positions shown; findings below may reference images not displayed]

FINDINGS: Mildly enlarged cardiac silhouette. Clear lungs with normal
vascularity. Mild lower thoracic spine degenerative change.
IMPRESSION: Mild cardiomegaly. No acute abnormality.

## 2022-08-13 IMAGING — CT CT HEART MORPH/PULM VEIN W/ CM & W/O CA SCORE
2 of 6 series · 12 of 20 positions shown, 14 images · IV contrast (Omni 300)
Comparison: None.
COMPARISON: None.

Addendum:
EXAM:
OVER-READ INTERPRETATION  CT CHEST

The following report is an over-read performed by radiologist Dr.
Kennedi Wash [REDACTED] on 10/26/2020. This over-read
does not include interpretation of cardiac or coronary anatomy or
pathology. The coronary CTA interpretation by the cardiologist is
attached.
TECHNIQUE: A non-contrast, gated CT scan was obtained with axial slices of 3 mm
through the heart for calcium scoring. Calcium scoring was performed
using the Agatston method. A 120 kV retrospective, gated, contrast
cardiac scan was obtained. Gantry rotation speed was 250 msecs and
collimation was 0.6 mm. Nitroglycerin was not given. A delayed scan
was obtained to exclude left atrial appendage thrombus. The 3D
dataset was reconstructed in 5% intervals of the 0-95% of the R-R
cycle. Late systolic phases were analyzed on a dedicated workstation
using MPR, MIP, and VRT modes. The patient received 80 cc of
contrast.

[Series 8: 0-90% · axial · 0.36mm/px · z∈[+1079,+1172]mm · 6 of 2610 slices shown, 8 images]
[im 373/2610  vessel]
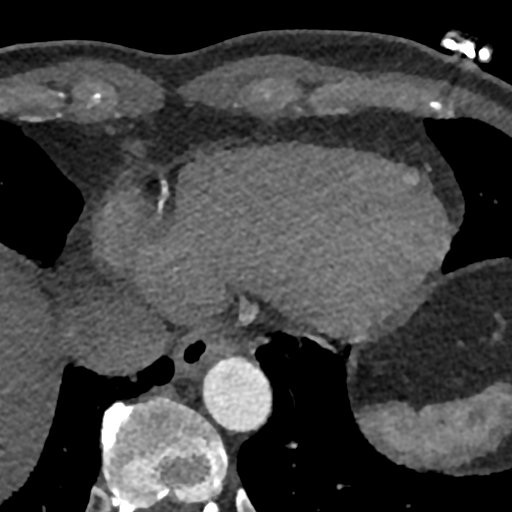
[im 373/2610  lung]
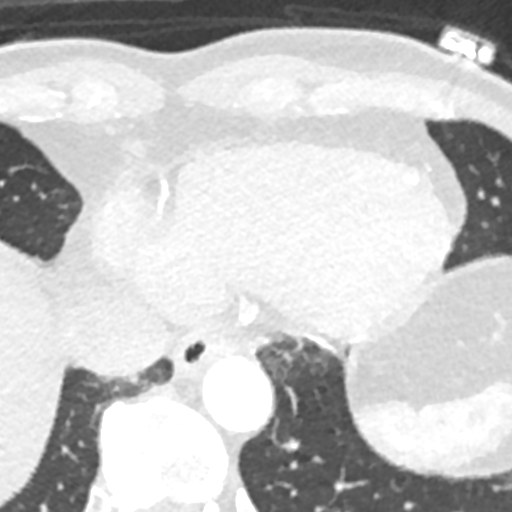
[im 746/2610  vessel]
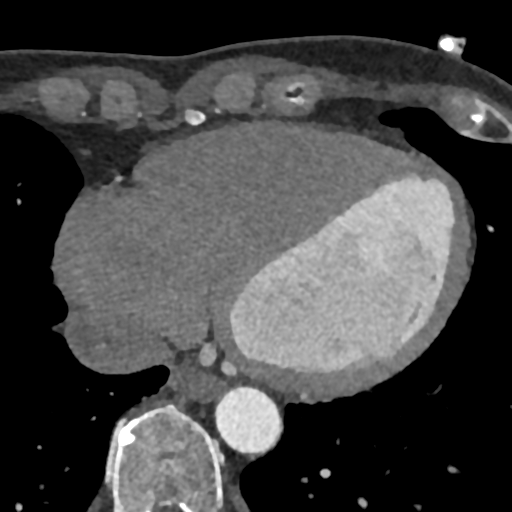
[im 1119/2610  vessel]
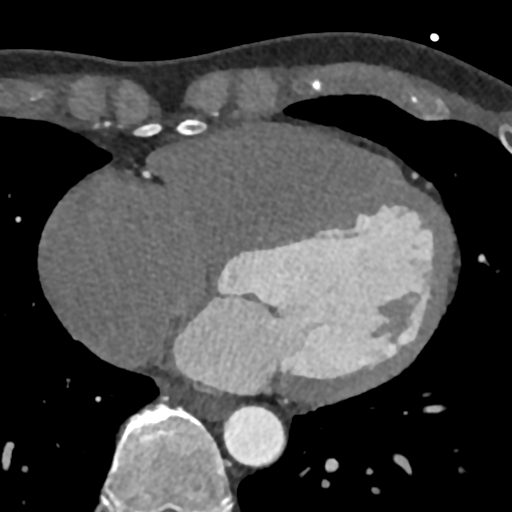
[im 1491/2610  vessel]
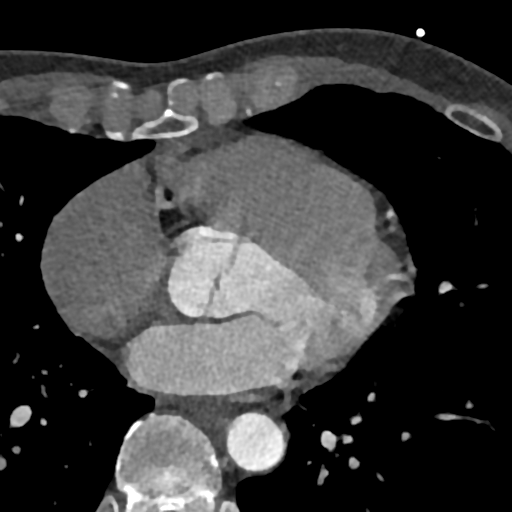
[im 1864/2610  vessel]
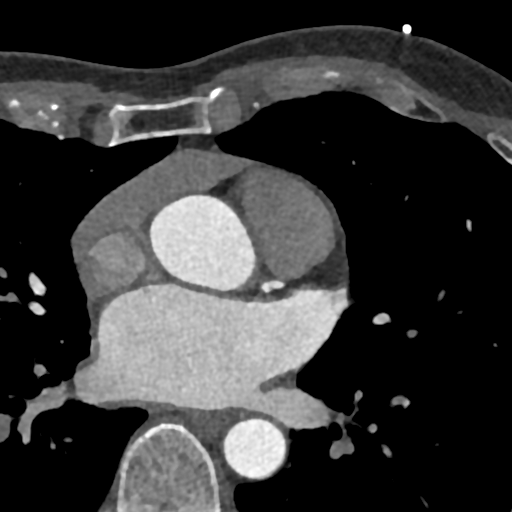
[im 1864/2610  lung]
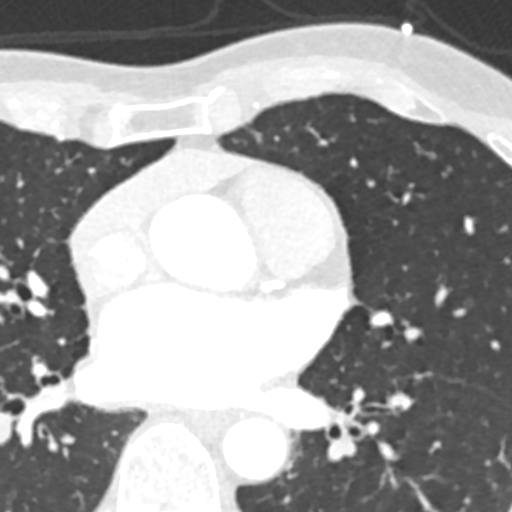
[im 2237/2610  vessel]
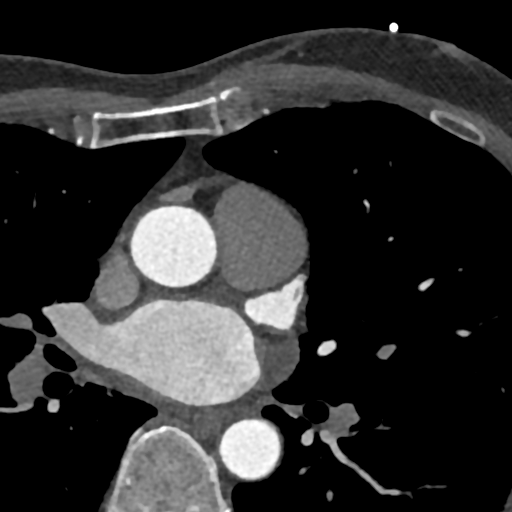

[Series 13: 5-95% · axial · 0.72mm/px · z∈[+1079,+1172]mm · 6 of 2610 slices shown]
[im 373/2610  vessel]
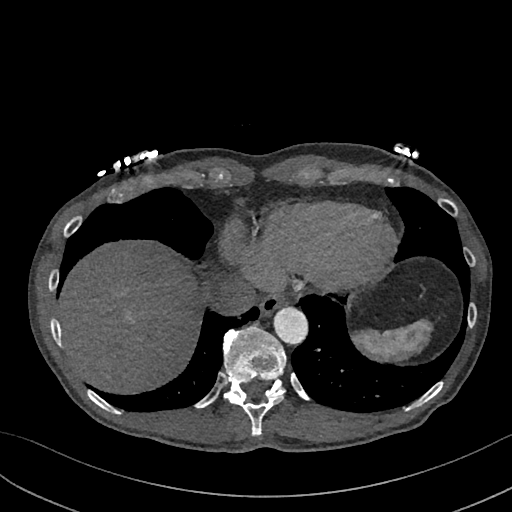
[im 746/2610  vessel]
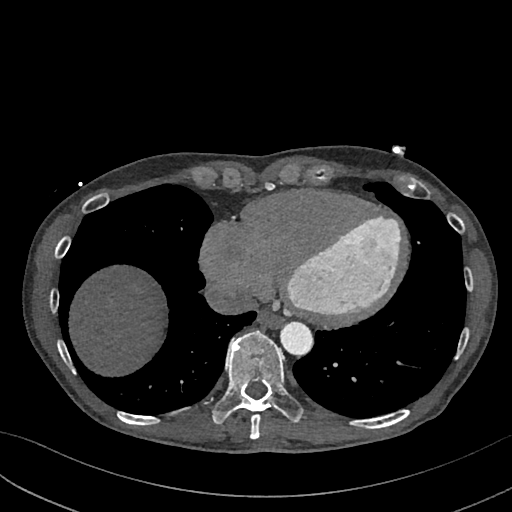
[im 1119/2610  vessel]
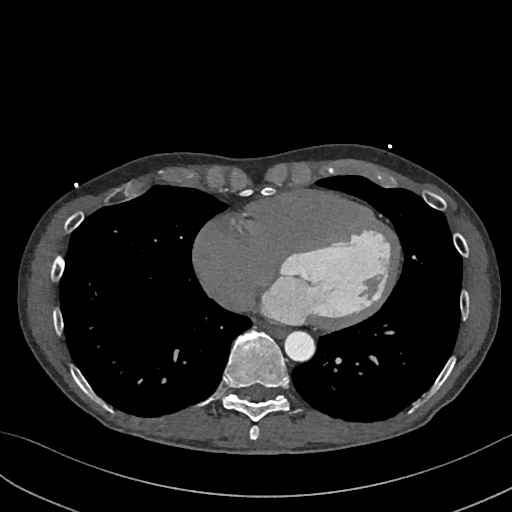
[im 1491/2610  vessel]
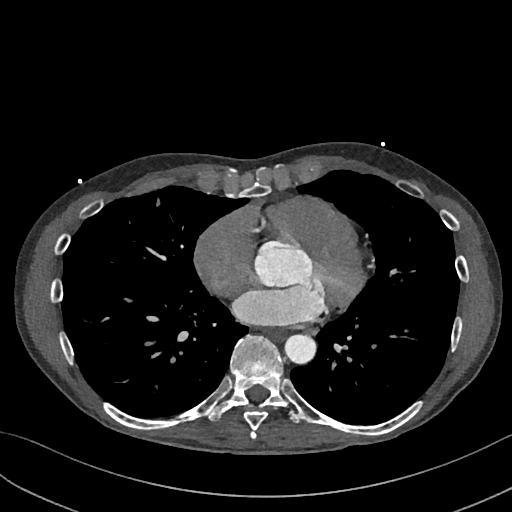
[im 1864/2610  vessel]
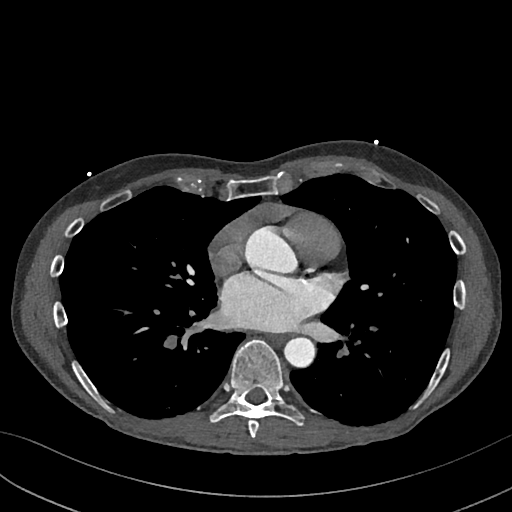
[im 2237/2610  vessel]
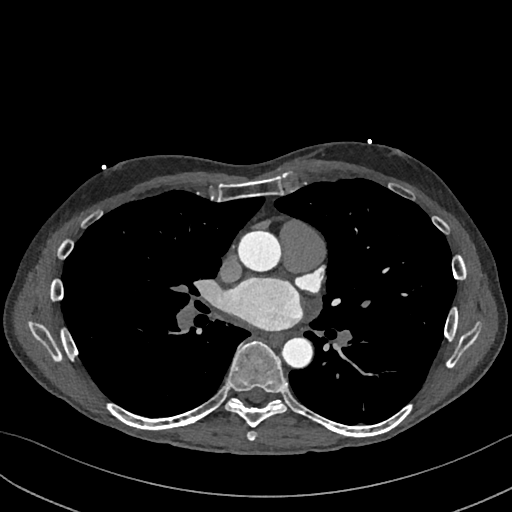

[12 of 20 positions shown; findings below may reference images not displayed]

FINDINGS: Vascular: Heart is normal size. Aortic root upper limits normal in
diameter at 3.9 cm. Aorta otherwise normal caliber.

Mediastinum/Nodes: No adenopathy

Lungs/Pleura: Subpleural nodule along the anterior right minor
fissure on image 19 measures 8 mm. No additional confluent opacities
or effusions.

Upper Abdomen: Imaging into the upper abdomen demonstrates no acute
findings.

Musculoskeletal: Chest wall soft tissues are unremarkable. No acute
bony abnormality.
IMPRESSION: 8 mm right middle lobe nodule along the minor fissure. Appearance
suggests a benign process, but recommend follow-up. Non-contrast
chest CT at 6-12 months is recommended. If the nodule is stable at
time of repeat CT, then future CT at 18-24 months (from today's
scan) is considered optional for low-risk patients, but is
recommended for high-risk patients. This recommendation follows the
consensus statement: Guidelines for Management of Incidental
Pulmonary Nodules Detected on CT Images: From the [REDACTED]AL DATA:  Atrial fibrillation scheduled for ablation.

EXAM:
Cardiac CTA
FINDINGS: Image quality: Excellent.

Noise artifact is: Limited.

Pulmonary Veins: There is normal pulmonary vein drainage into the
left atrium (2 on the right and 2 on the left) with ostial
measurements as follows:

RUPV: Ostium 20 mm x 18 mm  area 265 mm2

RLPV:  Ostium 19 mm x 18 mm  area 248 mm2

LUPV:  Ostium 23 mm x 20 mm area 344 mm2

LLPV:  Ostium 20 mm x 11 mm  area 162 mm2

Left Atrium: The left atrial size is dilated. There is no PFO/ASD.
The left atrial appendage is large broccoli type with two lobes and
ostial size 23.1 mm x 18.9 mm and length 18 mm. There is no thrombus
in the left atrial appendage on contrast or delayed imaging. The
esophagus runs in close proximity to the left inferior pulmonary
vein ostium.

Coronary Arteries: CAC score of 0. Normal coronary origin. Right
dominance. The study was performed without use of NTG and is
insufficient for plaque evaluation. No CAD in the proximal segments.

Right Atrium: Right atrial size is within normal limits.

Right Ventricle: The right ventricular cavity is within normal
limits.

Left Ventricle: The ventricular cavity size is within normal limits.
There are no stigmata of prior infarction. There is no abnormal
filling defect. Normal left ventricular function, EF 64%. No
regional wall motion abnormalities.

Pericardium: Normal thickness with no significant effusion or
calcium present.

Pulmonary Artery: Normal caliber without proximal filling defect.

Cardiac valves: The aortic valve is trileaflet without significant
calcification. The mitral valve is normal structure without
significant calcification.

Aorta: Mildly dilated aortic root up to 40 mm. Normal caliber
ascending aorta.

Extra-cardiac findings: See attached radiology report for
non-cardiac structures.
IMPRESSION: 1. There is normal pulmonary vein drainage into the left atrium with
ostial measurements above.

2. There is no thrombus in the left atrial appendage.

3. The esophagus runs in close proximity to the left inferior
pulmonary vein ostium.

4. No PFO/ASD.

5. Normal coronary origin. Right dominance.

6. CAC score of 0.  No CAD in the proximal segments.

7. Mild dilation of the aortic root up to 40 mm. Normal ascending
aorta.

*** End of Addendum ***
EXAM:
OVER-READ INTERPRETATION  CT CHEST

The following report is an over-read performed by radiologist Dr.
Kennedi Wash [REDACTED] on 10/26/2020. This over-read
does not include interpretation of cardiac or coronary anatomy or
pathology. The coronary CTA interpretation by the cardiologist is
attached.
FINDINGS: Vascular: Heart is normal size. Aortic root upper limits normal in
diameter at 3.9 cm. Aorta otherwise normal caliber.

Mediastinum/Nodes: No adenopathy

Lungs/Pleura: Subpleural nodule along the anterior right minor
fissure on image 19 measures 8 mm. No additional confluent opacities
or effusions.

Upper Abdomen: Imaging into the upper abdomen demonstrates no acute
findings.

Musculoskeletal: Chest wall soft tissues are unremarkable. No acute
bony abnormality.
IMPRESSION: 8 mm right middle lobe nodule along the minor fissure. Appearance
suggests a benign process, but recommend follow-up. Non-contrast
chest CT at 6-12 months is recommended. If the nodule is stable at
time of repeat CT, then future CT at 18-24 months (from today's
scan) is considered optional for low-risk patients, but is
recommended for high-risk patients. This recommendation follows the
consensus statement: Guidelines for Management of Incidental
Pulmonary Nodules Detected on CT Images: From the [HOSPITAL]
